# Patient Record
Sex: Male | Born: 1985 | Race: Black or African American | Hispanic: No | Marital: Single | Smoking: Current some day smoker
Health system: Southern US, Community
[De-identification: ages and names within clinical notes are randomized; demographics above are authoritative.]

## PROBLEM LIST (undated history)

## (undated) ENCOUNTER — Emergency Department (HOSPITAL_COMMUNITY): Payer: Self-pay

## (undated) DIAGNOSIS — F319 Bipolar disorder, unspecified: Secondary | ICD-10-CM

## (undated) DIAGNOSIS — F29 Unspecified psychosis not due to a substance or known physiological condition: Secondary | ICD-10-CM

## (undated) DIAGNOSIS — F209 Schizophrenia, unspecified: Secondary | ICD-10-CM

---

## 2006-10-02 DIAGNOSIS — F319 Bipolar disorder, unspecified: Secondary | ICD-10-CM

## 2006-10-02 HISTORY — DX: Bipolar disorder, unspecified: F31.9

## 2008-07-05 ENCOUNTER — Emergency Department (HOSPITAL_COMMUNITY): Admission: EM | Admit: 2008-07-05 | Discharge: 2008-07-06 | Payer: Self-pay | Admitting: Emergency Medicine

## 2008-07-23 ENCOUNTER — Ambulatory Visit: Payer: Self-pay | Admitting: Psychiatry

## 2008-07-23 ENCOUNTER — Inpatient Hospital Stay (HOSPITAL_COMMUNITY): Admission: AD | Admit: 2008-07-23 | Discharge: 2008-07-30 | Payer: Self-pay | Admitting: Psychiatry

## 2008-08-02 ENCOUNTER — Emergency Department (HOSPITAL_COMMUNITY): Admission: EM | Admit: 2008-08-02 | Discharge: 2008-08-02 | Payer: Self-pay | Admitting: Emergency Medicine

## 2009-06-12 ENCOUNTER — Emergency Department (HOSPITAL_COMMUNITY): Admission: EM | Admit: 2009-06-12 | Discharge: 2009-06-12 | Payer: Self-pay | Admitting: Emergency Medicine

## 2011-01-06 LAB — COMPREHENSIVE METABOLIC PANEL
ALT: 21 U/L (ref 0–53)
Albumin: 5.1 g/dL (ref 3.5–5.2)
Alkaline Phosphatase: 93 U/L (ref 39–117)
Calcium: 10 mg/dL (ref 8.4–10.5)
Glucose, Bld: 117 mg/dL — ABNORMAL HIGH (ref 70–99)
Potassium: 3.8 mEq/L (ref 3.5–5.1)
Sodium: 138 mEq/L (ref 135–145)
Total Protein: 8.3 g/dL (ref 6.0–8.3)

## 2011-01-06 LAB — CBC
MCHC: 34.1 g/dL (ref 30.0–36.0)
Platelets: 215 10*3/uL (ref 150–400)
RDW: 12.8 % (ref 11.5–15.5)

## 2011-01-06 LAB — DIFFERENTIAL
Basophils Relative: 0 % (ref 0–1)
Eosinophils Absolute: 0 10*3/uL (ref 0.0–0.7)
Lymphs Abs: 0.7 10*3/uL (ref 0.7–4.0)
Monocytes Absolute: 0.6 10*3/uL (ref 0.1–1.0)
Monocytes Relative: 5 % (ref 3–12)
Neutro Abs: 10.9 10*3/uL — ABNORMAL HIGH (ref 1.7–7.7)
Neutrophils Relative %: 89 % — ABNORMAL HIGH (ref 43–77)

## 2011-01-06 LAB — POCT CARDIAC MARKERS
CKMB, poc: 6.9 ng/mL (ref 1.0–8.0)
Myoglobin, poc: 498 ng/mL (ref 12–200)
Myoglobin, poc: >500 ng/mL (ref 12–200)
Troponin i, poc: <0.05 ng/mL (ref 0.00–0.09)

## 2011-01-06 LAB — POCT I-STAT, CHEM 8
BUN: 27 mg/dL — ABNORMAL HIGH (ref 6–23)
Calcium, Ion: 1.15 mmol/L (ref 1.12–1.32)
Creatinine, Ser: 1.4 mg/dL (ref 0.4–1.5)
Glucose, Bld: 112 mg/dL — ABNORMAL HIGH (ref 70–99)
TCO2: 24 mmol/L (ref 0–100)

## 2011-01-06 LAB — URINE MICROSCOPIC-ADD ON

## 2011-01-06 LAB — URINALYSIS, ROUTINE W REFLEX MICROSCOPIC
Glucose, UA: NEGATIVE mg/dL
Hgb urine dipstick: NEGATIVE
Specific Gravity, Urine: 1.035 — ABNORMAL HIGH (ref 1.005–1.030)

## 2011-01-06 LAB — ETHANOL: Alcohol, Ethyl (B): <5 mg/dL (ref 0–10)

## 2011-01-06 LAB — RAPID URINE DRUG SCREEN, HOSP PERFORMED: Barbiturates: NOT DETECTED

## 2011-02-14 NOTE — Discharge Summary (Signed)
NAME:  Isaac Cabrera, Isaac Cabrera NO.:  [id number]   MEDICAL RECORD NO.:  [id number]          PATIENT TYPE:  IPS   LOCATION:  0501                          FACILITY:  [facility]   PHYSICIAN:  Anselm Jungling, MD  DATE OF BIRTH:  [birth year]   DATE OF ADMISSION:  07/23/2008  DATE OF DISCHARGE:  07/30/2008                               DISCHARGE SUMMARY   IDENTIFYING DATA/REASON FOR ADMISSION:  The patient is a 25 year old  single African American male who was admitted for treatment of symptoms  of psychosis, in a context of substance abuse.  Please refer to the  admission note for further details pertaining to the symptoms,  circumstances and history that led to his hospitalization.  He was given  an initial Axis I diagnosis of mood disorder NOS, substance abuse NOS,  and psychosis NOS.   MEDICAL AND LABORATORY:  The patient was in good health without any  active or chronic medical problems.  He was medically and physically  assessed by the psychiatric nurse practitioner.  There were no  significant medical issues.   HOSPITAL COURSE:  The patient was admitted to the adult inpatient  psychiatric service.  He presented as a well-nourished, normally-  developed male who was alert, fully oriented, and pleasant and open in  the initial interview.  His thoughts and speech appeared normally  organized.  He did not appear to be depressed.  He gave [place] permission to  contact his parents.  He denied any subjective complaints whatsoever.   In contacting his parents, and obtaining further background information  on this young man, we learned that he had gone through significant  personality change over the previous year, having become progressively  more irresponsible, irritable, and involved in substance abuse,  apparently primarily marijuana.  He had become belligerent and sarcastic  with his family.  They also described psychotic behaviors, such as  putting gasoline on plants in the yard,  and other bizarre behaviors.  With this information, it appeared the patient was most likely  exhibiting signs and symptoms of an insidious onset of a schizophrenic  or schizoaffective disorder.  As such, he was treated with Risperdal,  and Depakote.  It turns out that the patient had been treated with these  medications previously, when similar symptoms had emerged in the past.   For the most part, he remained pleasant and cooperative throughout his  stay, active, talkative, and highly sociable.  He was generally pleasant  and in a good mood.  He did not show much insight at any point into the  nature of the problems that brought him, but at the very least was  giving lip service to the notion that he had a problem with drug abuse  and needed to remain abstinent.   His parents clarified for [place] while he was year that they would not take  him back into their home.  As such, the patient worked closely with case  manager towards finding of residential situation for himself.  Ultimately, the plan was for the patient to go live with his pastor.  Follow-up was to be arranged as below with the [facility].   AFTERCARE:  The patient was to be seen at the [facility] for an  appointment with their psychiatrist on August 06, 2008.   DISCHARGE MEDICATIONS:  1. Risperdal 3 mg nightly.  2. Depakote 1000 mg nightly.   DISCHARGE DIAGNOSES:  AXIS I:  1. Schizoaffective disorder not otherwise specified.  2. Polysubstance abuse, early remission.  AXIS II:  Deferred.  AXIS III:  No acute or chronic illnesses.  AXIS IV:  Stressors severe.  AXIS V: Global assessment of functioning on discharge 55.      Anselm Jungling, MD  Electronically Signed     SPB/MEDQ  D:  07/31/2008  T:  07/31/2008  Job:  [id number]

## 2011-02-14 NOTE — H&P (Signed)
NAME:  Isaac Cabrera, Isaac Cabrera NO.:  [id number]   MEDICAL RECORD NO.:  [id number]          PATIENT TYPE:  IPS   LOCATION:  0402                          FACILITY:  [facility]   PHYSICIAN:  Anselm Jungling, MD  DATE OF BIRTH:  19-Mar-1986   DATE OF ADMISSION:  07/23/2008  DATE OF DISCHARGE:                       PSYCHIATRIC ADMISSION ASSESSMENT   IDENTIFYING INFORMATION:  This is a 25 year old African American male  who is single.  This is an involuntary admission.   HISTORY OF THE PRESENT ILLNESS:  This is the first [facility] admission for  this young man after his parents petitioned on him on or about July 19, 2008.  He was taken to [facility] and was there for several  days awaiting transfer to [facility], however, no bed was  available so he was referred to our service.  He is petitioned for  behaviors including threatening his parents who called police to the  home.  [facility] has reported that he has a history of  schizoaffective disorder and has been noncompliant with Risperdal in the  past.  His parents have complained that he is also hearing voices which  was validated by [facility].  Today he is a fully alert male in  full contact with reality with normal speech denying any suicidal or  homicidal thoughts.  He admits that he had an argument with his father.  Drinking and using drugs is unclear.  His urine drug screen done in the  emergency room on July 05, 2008, was positive for  tetrahydrocannabinoids and his alcohol level was less than 5.  He denies  any hallucinations.  Denies any dangerous thoughts today.   PAST PSYCHIATRIC HISTORY:  First [facility] admission.  He reports he has not  been taking any medications.  He has a history of 1 prior admission at  [facility] in January of this year and [facility] had reported that he was previously noncompliant with Risperdal,  Invega, Depakote, and Cogentin.   SOCIAL  HISTORY:  A single African American male living at home with his  parents.  Unemployed.  No children.   FAMILY HISTORY:  Not available.   MEDICAL HISTORY:  No regular primary care.   MEDICAL PROBLEMS:  None.   MEDICATIONS:  None.   DRUG ALLERGIES:  None.   PHYSICAL EXAM:  Well-nourished, well-developed [place] American male.  He is in no distress, pleasant, cooperative, 6 feet 2 inches tall, 156  pounds.  Temp 97.1.  Pulse 68.  Respirations 16.  Blood pressure 135/85.  Well nourished and well developed, in no acute distress.  HEAD:  Normocephalic and atraumatic.  EENT:  PERRL.  Sclerae nonicteric.  Extraocular movements are normal.  NECK:  Supple.  No thyromegaly.  No lymphadenopathy.  CHEST:  Clear to auscultation.  CARDIOVASCULAR:  S1 and S2 are heard.  No clicks, murmurs, gallops, or  extra sounds.  ABDOMEN:  Soft, nontender, nondistended.  EXTREMITIES:  Have 2+ pulses, pink and warm, good circulation.  GENITALIA:  Deferred.  NEURO:  Cranial nerves II-XII are intact.  Motor  is intact.  Sensory is  intact.  Gait is normal.  Normal arm swing.  Romberg without findings  and nonfocal.   BASIC LABS:  Were done on July 05, 2008, in the emergency room.  Chemistry, sodium 139, potassium 3.7, chloride 106, carbon dioxide 23,  BUN 17, creatinine 1.27, random glucose 74.  Liver enzymes, SGOT 41,  SGPT 20, alkaline phosphatase is 73, and total bilirubin 3.1.  Routine  urinalysis was remarkable for mildly elevated, specific gravity at  1.041, ketones greater than 80, protein 100 mg/dL.  CBC, WBC 8.8,  hemoglobin 15.3, hematocrit 45.1, platelets 234,000, and MCV 89.8.   MENTAL STATUS EXAM:  Fully alert male, pleasant, cooperative, with  superficial insight.  Speech is normal and nonpressured.  Mood is  neutral.  Thought process logical and coherent.  He is oriented x4.  Eye  contact is adequate.  Speech is normally organized.  He does not appear  to be depressed.  No delusional  statements made.  Does not appear to be  internally distracted.  He gave [place] an okay to contact his parents.  Denies any subjective complaints whatsoever.  Admits that he had an  argument with his father.   AXIS I:  1. Rule out mood disorder, NOS.  2. Rule out psychosis.  AXIS II:  No diagnosis.  AXIS III:  No diagnosis.  AXIS IV:  Rule out conflict with parents.  AXIS V:  Current 64, past year not known.   PLAN:  Involuntarily admit him.  We are going to contact his parents,  try to get some additional history, determine what the issues are and  see what their concerns are.      Margaret A. Lorin Picket, N.P.      Anselm Jungling, MD  Electronically Signed    MAS/MEDQ  D:  07/24/2008  T:  07/24/2008  Job:  [id number]

## 2011-07-04 LAB — COMPREHENSIVE METABOLIC PANEL
Albumin: 4.3
BUN: 18
CO2: 23
Calcium: 9.6
Chloride: 104
Creatinine, Ser: 1.27
Creatinine, Ser: 1.07
GFR calc Af Amer: >60
GFR calc non Af Amer: >60
GFR calc non Af Amer: >60
Glucose, Bld: 74
Total Bilirubin: 1.7 — ABNORMAL HIGH

## 2011-07-04 LAB — COMPREHENSIVE METABOLIC PANEL WITH GFR
ALT: 20
AST: 41 — ABNORMAL HIGH
Albumin: 4.5
Alkaline Phosphatase: 73
BUN: 17
Chloride: 106
Potassium: 3.7
Sodium: 139
Total Bilirubin: 3.1 — ABNORMAL HIGH
Total Protein: 7

## 2011-07-04 LAB — URINALYSIS, ROUTINE W REFLEX MICROSCOPIC
Bilirubin Urine: NEGATIVE
Glucose, UA: NEGATIVE
Glucose, UA: NEGATIVE
Hgb urine dipstick: NEGATIVE
Ketones, ur: >80 — AB
Leukocytes, UA: NEGATIVE
Nitrite: NEGATIVE
Protein, ur: 100 — AB
Specific Gravity, Urine: 1.041 — ABNORMAL HIGH
Urobilinogen, UA: 0.2
pH: 6
pH: 6

## 2011-07-04 LAB — DIFFERENTIAL
Basophils Absolute: 0
Basophils Absolute: 0
Basophils Relative: 1
Eosinophils Absolute: 1 — ABNORMAL HIGH
Eosinophils Relative: 11 — ABNORMAL HIGH
Lymphocytes Relative: 15
Lymphocytes Relative: 16
Lymphs Abs: 1.3
Monocytes Absolute: 0.6
Monocytes Absolute: 0.4
Monocytes Relative: 7
Neutro Abs: 5.8
Neutro Abs: 3.9
Neutrophils Relative %: 66
Neutrophils Relative %: 71

## 2011-07-04 LAB — RAPID URINE DRUG SCREEN, HOSP PERFORMED
Amphetamines: NOT DETECTED
Barbiturates: NOT DETECTED
Barbiturates: NOT DETECTED
Benzodiazepines: NOT DETECTED
Benzodiazepines: POSITIVE — AB
Cocaine: NOT DETECTED
Cocaine: NOT DETECTED
Opiates: NOT DETECTED
Tetrahydrocannabinol: POSITIVE — AB

## 2011-07-04 LAB — CBC
HCT: 45.1
HCT: 46.4
Hemoglobin: 15.3
MCHC: 33.9
MCV: 89.8
MCV: 90.9
Platelets: 234
Platelets: 238
RBC: 5.02
RDW: 13.5
WBC: 8.8
WBC: 5.5

## 2011-07-04 LAB — URINE MICROSCOPIC-ADD ON

## 2011-07-04 LAB — ETHANOL: Alcohol, Ethyl (B): <5

## 2012-07-01 ENCOUNTER — Encounter (HOSPITAL_COMMUNITY): Payer: Self-pay | Admitting: *Deleted

## 2012-07-01 ENCOUNTER — Emergency Department (HOSPITAL_COMMUNITY)
Admission: EM | Admit: 2012-07-01 | Discharge: 2012-07-01 | Discharge status: Against Medical Advice | Payer: Self-pay | Attending: Emergency Medicine | Admitting: Emergency Medicine

## 2012-07-01 DIAGNOSIS — Z5900 Homelessness unspecified: Secondary | ICD-10-CM | POA: Insufficient documentation

## 2012-07-01 LAB — RAPID URINE DRUG SCREEN, HOSP PERFORMED
Amphetamines: NOT DETECTED
Benzodiazepines: NOT DETECTED

## 2012-07-01 LAB — CBC
Platelets: 257 10*3/uL (ref 150–400)
RDW: 12.3 % (ref 11.5–15.5)
WBC: 6.4 10*3/uL (ref 4.0–10.5)

## 2012-07-01 LAB — COMPREHENSIVE METABOLIC PANEL
AST: 27 U/L (ref 0–37)
Albumin: 4.4 g/dL (ref 3.5–5.2)
Alkaline Phosphatase: 65 U/L (ref 39–117)
Chloride: 98 mEq/L (ref 96–112)
Creatinine, Ser: 1.2 mg/dL (ref 0.50–1.35)
Potassium: 3.7 mEq/L (ref 3.5–5.1)
Sodium: 135 mEq/L (ref 135–145)
Total Bilirubin: 1.1 mg/dL (ref 0.3–1.2)

## 2012-07-01 LAB — ACETAMINOPHEN LEVEL: Acetaminophen (Tylenol), Serum: <15 ug/mL (ref 10–30)

## 2012-07-01 LAB — SALICYLATE LEVEL: Salicylate Lvl: <2 mg/dL — ABNORMAL LOW (ref 2.8–20.0)

## 2012-07-01 NOTE — ED Notes (Signed)
Pt denies SI, then states he is "slightly suicidal". When asked if he made a plan, states "I didn't think I'd live this long" Pt states "I'm out of my luck." Pt states "My parents kicked me out. My boss kicked me out. I don't have anywhere to go." Pt unable to state if he has a mental illness but stated he needs "lithium or something" Pt states he has taken medications before but unable to remember what meds. Pt is belligerent and uncooperative. Pt asking [employer] if he can go to [employer].

## 2012-07-01 NOTE — ED Notes (Signed)
Patient is alert and oriented x3.  He left AMA.  Patient gave verbal understanding of his need to stay.   He was not showing any signs of distress on DC

## 2012-08-07 ENCOUNTER — Encounter (HOSPITAL_COMMUNITY): Payer: Self-pay | Admitting: Emergency Medicine

## 2012-08-07 ENCOUNTER — Emergency Department (HOSPITAL_COMMUNITY)
Admission: EM | Admit: 2012-08-07 | Discharge: 2012-08-11 | Disposition: A | Discharge status: Other Acute Inpatient Hospital | Payer: Self-pay | Attending: Emergency Medicine | Admitting: Emergency Medicine

## 2012-08-07 DIAGNOSIS — F17200 Nicotine dependence, unspecified, uncomplicated: Secondary | ICD-10-CM | POA: Insufficient documentation

## 2012-08-07 DIAGNOSIS — F209 Schizophrenia, unspecified: Secondary | ICD-10-CM | POA: Insufficient documentation

## 2012-08-07 DIAGNOSIS — F29 Unspecified psychosis not due to a substance or known physiological condition: Secondary | ICD-10-CM

## 2012-08-07 HISTORY — DX: Schizophrenia, unspecified: F20.9

## 2012-08-07 HISTORY — DX: Unspecified psychosis not due to a substance or known physiological condition: F29

## 2012-08-07 LAB — CBC WITH DIFFERENTIAL/PLATELET
Basophils Absolute: 0 10*3/uL (ref 0.0–0.1)
Basophils Relative: 1 % (ref 0–1)
Eosinophils Absolute: 0.2 10*3/uL (ref 0.0–0.7)
Lymphs Abs: 2.3 10*3/uL (ref 0.7–4.0)
MCH: 31.1 pg (ref 26.0–34.0)
MCHC: 35.6 g/dL (ref 30.0–36.0)
Neutrophils Relative %: 58 % (ref 43–77)
Platelets: 246 10*3/uL (ref 150–400)
RBC: 4.53 MIL/uL (ref 4.22–5.81)
RDW: 12.2 % (ref 11.5–15.5)

## 2012-08-07 LAB — BASIC METABOLIC PANEL
Calcium: 9.5 mg/dL (ref 8.4–10.5)
GFR calc non Af Amer: >90 mL/min (ref >90)
Glucose, Bld: 92 mg/dL (ref 70–99)
Sodium: 141 mEq/L (ref 135–145)

## 2012-08-07 LAB — RAPID URINE DRUG SCREEN, HOSP PERFORMED
Cocaine: NOT DETECTED
Opiates: NOT DETECTED

## 2012-08-07 MED ORDER — ZOLPIDEM TARTRATE 5 MG PO TABS: 5.0000 mg | ORAL_TABLET | Freq: Every evening | ORAL | Status: DC | PRN

## 2012-08-07 MED ORDER — ACETAMINOPHEN 325 MG PO TABS: 650.0000 mg | ORAL_TABLET | ORAL | Status: DC | PRN

## 2012-08-07 MED ORDER — LORAZEPAM 1 MG PO TABS
1.0000 mg | ORAL_TABLET | Freq: Three times a day (TID) | ORAL | Status: DC | PRN
  Administered 2012-08-11: 1 mg via ORAL
  Filled 2012-08-07: qty 1

## 2012-08-07 MED ORDER — NICOTINE 21 MG/24HR TD PT24: 21.0000 mg | MEDICATED_PATCH | Freq: Every day | TRANSDERMAL | Status: DC | PRN

## 2012-08-07 MED ORDER — IBUPROFEN 200 MG PO TABS: 400.0000 mg | ORAL_TABLET | Freq: Three times a day (TID) | ORAL | Status: DC | PRN

## 2012-08-07 MED ORDER — ALUM & MAG HYDROXIDE-SIMETH 200-200-20 MG/5ML PO SUSP: 30.0000 mL | ORAL | Status: DC | PRN

## 2012-08-07 MED ORDER — ONDANSETRON HCL 4 MG PO TABS: 4.0000 mg | ORAL_TABLET | Freq: Three times a day (TID) | ORAL | Status: DC | PRN

## 2012-08-07 NOTE — ED Notes (Signed)
Bed:WA25<BR> Expected date:<BR> Expected time:<BR> Means of arrival:Ambulance<BR> Comments:<BR> fall

## 2012-08-07 NOTE — ED Notes (Signed)
Patient has two personal belonging bags, security put bags in locker 34

## 2012-08-07 NOTE — ED Provider Notes (Signed)
History     CSN: [id number]  Arrival date & time 08/07/12  1118   First MD Initiated Contact with Patient 08/07/12 1126      Chief Complaint  Patient presents with  . Medical Clearance     The history is provided by the patient, the EMS personnel and the police. History Limited By: Hx psychosis.   Pt was seen at 1140.  Per EMS and Police, pt found sleeping on his father's yard, threatening to burn the house down.  Brought in under IVC for "not taking care of himself" (ie: hygiene).  Pt states he "hasn't taken my meds" in 3 years for unk mental illness.  Pt states he stopped taking his meds because "they were poisoning me" and he "doesn't need them" because Jesus has helped him "because the bible floats."     Past Medical History  Diagnosis Date  . Schizophrenia   . Psychosis     History reviewed. No pertinent past surgical history.   History  Substance Use Topics  . Smoking status: Current Some Day Smoker    Types: Cigarettes  . Smokeless tobacco: Not on file  . Alcohol Use: No      Review of Systems  Unable to perform ROS: Psychiatric disorder    Allergies  Review of patient's allergies indicates no known allergies.  Home Medications  No current outpatient prescriptions on file.  BP 130/86  Pulse 82  Temp 98.1 F (36.7 C) (Oral)  Resp 18  SpO2 97%  Physical Exam 1145: Physical examination:  Nursing notes reviewed; Vital signs and O2 SAT reviewed;  Constitutional: Well developed, Well nourished, Well hydrated, In no acute distress; Head:  Normocephalic, atraumatic; Eyes: EOMI, PERRL, No scleral icterus; ENMT: Mouth and pharynx normal, Mucous membranes moist; Neck: Supple, Full range of motion; Cardiovascular: Regular rate and rhythm.; Respiratory: Speaking full sentences with ease, Normal respiratory effort/excursion; Chest: Nontender, Movement normal;; Extremities: Pulses normal, No tenderness, No edema, No calf edema or asymmetry.; Neuro: AA&Ox3, Major CN  grossly intact.  Speech clear. Gait steady. No gross focal motor or sensory deficits in extremities.; Skin: Color normal, Warm, Dry.; Psych:  Affect flat, poor eye contact, +random, rapid, tangential speech, +appears responding to internal stimuli.    ED Course  Procedures     MDM  MDM Reviewed: previous chart, nursing note and vitals Interpretation: labs     Results for orders placed during the hospital encounter of 08/07/12  BASIC METABOLIC PANEL      Component Value Range   Sodium 141  135 - 145 mEq/L   Potassium 3.8  3.5 - 5.1 mEq/L   Chloride 105  96 - 112 mEq/L   CO2 24  19 - 32 mEq/L   Glucose, Bld 92  70 - 99 mg/dL   BUN 21  6 - 23 mg/dL   Creatinine, Ser 4.09  0.50 - 1.35 mg/dL   Calcium 9.5  8.4 - 81.1 mg/dL   GFR calc non Af Amer >90  >90 mL/min   GFR calc Af Amer >90  >90 mL/min  CBC WITH DIFFERENTIAL      Component Value Range   WBC 7.3  4.0 - 10.5 K/uL   RBC 4.53  4.22 - 5.81 MIL/uL   Hemoglobin 14.1  13.0 - 17.0 g/dL   HCT 91.4  78.2 - 95.6 %   MCV 87.4  78.0 - 100.0 fL   MCH 31.1  26.0 - 34.0 pg   MCHC 35.6  30.0 -  36.0 g/dL   RDW 16.1  09.6 - 04.5 %   Platelets 246  150 - 400 K/uL   Neutrophils Relative 58  43 - 77 %   Neutro Abs 4.2  1.7 - 7.7 K/uL   Lymphocytes Relative 32  12 - 46 %   Lymphs Abs 2.3  0.7 - 4.0 K/uL   Monocytes Relative 7  3 - 12 %   Monocytes Absolute 0.5  0.1 - 1.0 K/uL   Eosinophils Relative 3  0 - 5 %   Eosinophils Absolute 0.2  0.0 - 0.7 K/uL   Basophils Relative 1  0 - 1 %   Basophils Absolute 0.0  0.0 - 0.1 K/uL  URINE RAPID DRUG SCREEN (HOSP PERFORMED)      Component Value Range   Opiates NONE DETECTED  NONE DETECTED   Cocaine NONE DETECTED  NONE DETECTED   Benzodiazepines NONE DETECTED  NONE DETECTED   Amphetamines NONE DETECTED  NONE DETECTED   Tetrahydrocannabinol NONE DETECTED  NONE DETECTED   Barbiturates NONE DETECTED  NONE DETECTED  ETHANOL      Component Value Range   Alcohol, Ethyl (B) <11  0 - 11 mg/dL       4098:  Pt calm /cooperative. ACT aware of need for pt eval for admit.          Laray Anger, DO 08/08/12 1531

## 2012-08-07 NOTE — ED Notes (Signed)
Per patient, states he was sleeping in dads yard and the police brought him here-per police, was making threats to burn house down

## 2012-08-07 NOTE — BH Assessment (Signed)
Assessment Note   Isaac Cabrera is a 26 y.o. male who presents to [facility] under IV petition. Petition was taken out by pt's father Dozier Berkovich [phone]). Per petition, pt has been behaving irrationally, not tending to hygine, not eating or sleeping properly, and stated that he wanted to be put to sleep like a dog. Petition also states that pt threatened to burn his parents house down.  This Clinical research associate spoke with pt's father via phone. Per father, pt has a long history of mental health concerns and has received inpatient treatment numerous times in several different states between 2007 and currently. He reports pt recently lost his job and has been homeless for the past few weeks. father states family has a restraining order against pt and that pt has been attempting to throw bricks through their windows and has been sleeping on their porch. He also states that pt threatened to burn the families house down.   Pt denies SI, HI, AHVH, and SA. He states he is here because he is homeless and the police brought him the hospital. He reports he has not been eating or sleeping because he has no where to sleep and no way to get food. He reports a history of mental health concerns, stating he has received inpatient treatment several times. He endorses current depressive symptoms and anxiety. He denies being on any medications. Pt is calm, cooperative, and oriented times 4.       Axis I: Schizoaffective Disorder Axis II: Deferred Axis III:  Past Medical History  Diagnosis Date  . Schizophrenia   . Psychosis    Axis IV: other psychosocial or environmental problems Axis V: 31-40 impairment in reality testing  Past Medical History:  Past Medical History  Diagnosis Date  . Schizophrenia   . Psychosis     History reviewed. No pertinent past surgical history.  Family History: No family history on file.  Social History:  reports that he has been smoking Cigarettes.  He does not have any smokeless tobacco  history on file. He reports that he does not drink alcohol or use illicit drugs.  Additional Social History:  Alcohol / Drug Use History of alcohol / drug use?: Yes Substance #1 Name of Substance 1: THC 1 - Age of First Use: teens 1 - Amount (size/oz): varries 1 - Frequency: varries  1 - Duration: on and off since teens 1 - Last Use / Amount: states he has not used for several months  CIWA: CIWA-Ar BP: 108/69 mmHg Pulse Rate: 55  COWS:    Allergies: No Known Allergies  Home Medications:  (Not in a hospital admission)  OB/GYN Status:  No LMP for male patient.  General Assessment Data Location of Assessment: [facility] Living Arrangements: Other (Comment) (homeless) Can pt return to current living arrangement?: Yes Admission Status: Voluntary Is patient capable of signing voluntary admission?: Yes Transfer from: Acute Hospital Referral Source: MD  Education Status Is patient currently in school?: No Highest grade of school patient has completed: highschool  Risk to self Suicidal Ideation: No-Not Currently/Within Last 6 Months Suicidal Intent: No Is patient at risk for suicide?: No Suicidal Plan?: No Access to Means: No What has been your use of drugs/alcohol within the last 12 months?: THC Previous Attempts/Gestures: No How many times?: 0  Other Self Harm Risks: none Triggers for Past Attempts: None known Intentional Self Injurious Behavior: None Family Suicide History: No Recent stressful life event(s): Other (Comment) (recent homelessness) Persecutory voices/beliefs?: No (pt denies) Depression:  Yes Depression Symptoms: Despondent;Feeling angry/irritable Substance abuse history and/or treatment for substance abuse?: Yes Suicide prevention information given to non-admitted patients: Not applicable  Risk to Others Current Homicidal Intent: No Current Homicidal Plan: No Access to Homicidal Means: No Identified Victim: none History of harm to others?:  Yes Assessment of Violence: None Noted Violent Behavior Description: cooperative Does patient have access to weapons?: No Criminal Charges Pending?: Yes Describe Pending Criminal Charges: resiting aresset  (assault on goverment offical, intixicated and disruptive) Does patient have a court date:  (unknown, pt denies)  Psychosis Hallucinations: None noted (pt has a history of auditory hallucinations ) Delusions: None noted  Mental Status Report Appear/Hygiene: Disheveled Eye Contact: Poor Motor Activity: Unremarkable Speech: Logical/coherent Level of Consciousness: Quiet/awake Mood: Ambivalent Affect: Apathetic Anxiety Level: None Thought Processes: Coherent;Relevant Judgement: Unimpaired Orientation: Person;Place;Time;Situation Obsessive Compulsive Thoughts/Behaviors: None  Cognitive Functioning Concentration: Normal Memory: Recent Intact;Remote Intact IQ: Average Insight: Poor Impulse Control: Poor Appetite: Fair Weight Loss: 0  Weight Gain: 0  Sleep: Decreased Vegetative Symptoms: None  ADLScreening [facility] Assessment Services) Patient's cognitive ability adequate to safely complete daily activities?: Yes Patient able to express need for assistance with ADLs?: Yes Independently performs ADLs?: Yes (appropriate for developmental age)  Abuse/Neglect [facility]) Physical Abuse: Denies Verbal Abuse: Denies Sexual Abuse: Denies  Prior Inpatient Therapy Prior Inpatient Therapy: Yes Prior Therapy Dates: 2007-2013 Prior Therapy Facilty/Provider(s): [facility], [facility], [facility], [facility], and out of state facilites Reason for Treatment: HI, [facility], and SA  Prior Outpatient Therapy Prior Outpatient Therapy: No Prior Therapy Dates: na Prior Therapy Facilty/Provider(s): na Reason for Treatment: na  ADL Screening (condition at time of admission) Patient's cognitive ability adequate to safely complete daily activities?: Yes Patient able to express need for assistance with ADLs?:  Yes Independently performs ADLs?: Yes (appropriate for developmental age) Weakness of Legs: None Weakness of Arms/Hands: None  Home Assistive Devices/Equipment Home Assistive Devices/Equipment: None    Abuse/Neglect Assessment (Assessment to be complete while patient is alone) Physical Abuse: Denies Verbal Abuse: Denies Sexual Abuse: Denies Exploitation of patient/patient's resources: Denies Self-Neglect: Denies Values / Beliefs Cultural Requests During Hospitalization: None Spiritual Requests During Hospitalization: None   Advance Directives (For Healthcare) Advance Directive: Patient does not have advance directive;Patient would not like information Pre-existing out of facility DNR order (yellow form or pink MOST form): No Nutrition Screen- [facility] Patient's home diet: Regular Have you recently lost weight without trying?: No Have you been eating poorly because of a decreased appetite?: No Malnutrition Screening Tool Score: 0   Additional Information 1:1 In Past 12 Months?: No CIRT Risk: No Elopement Risk: No Does patient have medical clearance?: Yes     Disposition:  Disposition Disposition of Patient: Other dispositions (pending telepsych)  On Site Evaluation by:   Reviewed with Physician:     Georgina Quint A 08/07/2012 10:57 PM

## 2012-08-08 MED ORDER — LORAZEPAM 2 MG/ML IJ SOLN: 2.0000 mg | Freq: Four times a day (QID) | INTRAMUSCULAR | Status: DC | PRN

## 2012-08-08 MED ORDER — LORAZEPAM 1 MG PO TABS: 2.0000 mg | ORAL_TABLET | Freq: Four times a day (QID) | ORAL | Status: DC | PRN

## 2012-08-08 MED ORDER — ZIPRASIDONE MESYLATE 20 MG IM SOLR
20.0000 mg | Freq: Once | INTRAMUSCULAR | Status: AC
  Administered 2012-08-08: 20 mg via INTRAMUSCULAR

## 2012-08-08 MED ORDER — ZIPRASIDONE MESYLATE 20 MG IM SOLR
20.0000 mg | Freq: Two times a day (BID) | INTRAMUSCULAR | Status: DC | PRN
  Administered 2012-08-10: 20 mg via INTRAMUSCULAR
  Filled 2012-08-08 (×2): qty 20

## 2012-08-08 NOTE — ED Provider Notes (Signed)
Pt sleeping comfortably this morning. He has hx of schizophrenia, has been accepted to [facility], awaiting availability of bed.    Ethelda Chick, MD 08/08/12 [phone]

## 2012-08-08 NOTE — ED Provider Notes (Signed)
  Physical Exam  BP 108/69  Pulse 55  Temp 97.9 F (36.6 C) (Oral)  Resp 18  SpO2 99%  Physical Exam  ED Course  Procedures  MDM Telemetry psychiatry evaluation and agrees with inpatient stay, recommends Geodon now, Geodon every 12 hours and Ativan every 6 hours both when necessary. I have written these orders     Vida Roller, MD 08/08/12 [phone]

## 2012-08-09 NOTE — ED Notes (Signed)
Up to the bathroom 

## 2012-08-09 NOTE — ED Notes (Signed)
Up in room, watching tv, eating snack.

## 2012-08-09 NOTE — ED Provider Notes (Signed)
Isaac Cabrera is a 26 y.o. male waiting [placement at the [facility]. He has been accepted. He has no c/o  [name], MD 08/09/12 [phone]

## 2012-08-09 NOTE — ED Notes (Signed)
Dr wentz into see 

## 2012-08-09 NOTE — ED Notes (Signed)
Up to the bathroom to shower and change scubs 

## 2012-08-09 NOTE — [facility] Counselor (Signed)
Pt has been accepted to [facility] by Donell Sievert to the care of Dr. Jannifer Franklin room 403-1. EDP notified and agrees with plan. Support paperwork has been faxed to [facility]

## 2012-08-09 NOTE — ED Notes (Signed)
Pt unable to go to [facility] at this time.  Room became unavailable.

## 2012-08-10 NOTE — ED Notes (Signed)
Up to the bathroom 

## 2012-08-10 NOTE — ED Provider Notes (Signed)
8:04 AM Awaiting placement. Was accepted to [facility], now, no bed available. No complaints today  Filed Vitals:   08/10/12 0411  BP: 118/68  Pulse: 54  Temp: 97.2 F (36.2 C)  Resp: 18     Lyanne Co, MD 08/10/12 [phone]

## 2012-08-10 NOTE — ED Notes (Signed)
patient went into shower.

## 2012-08-10 NOTE — ED Notes (Signed)
Double portions TORB dr campos 

## 2012-08-11 ENCOUNTER — Encounter (HOSPITAL_COMMUNITY): Payer: Self-pay | Admitting: *Deleted

## 2012-08-11 ENCOUNTER — Inpatient Hospital Stay (HOSPITAL_COMMUNITY)
Admission: AD | Admit: 2012-08-11 | Discharge: 2012-08-19 | DRG: 885 | Disposition: A | Discharge status: Home / Self Care | Payer: Federal, State, Local not specified - Other | Source: Ambulatory Visit | Attending: Psychiatry | Admitting: Psychiatry

## 2012-08-11 DIAGNOSIS — F602 Antisocial personality disorder: Secondary | ICD-10-CM | POA: Diagnosis present

## 2012-08-11 DIAGNOSIS — F39 Unspecified mood [affective] disorder: Secondary | ICD-10-CM | POA: Diagnosis present

## 2012-08-11 DIAGNOSIS — M25529 Pain in unspecified elbow: Secondary | ICD-10-CM | POA: Diagnosis present

## 2012-08-11 DIAGNOSIS — F22 Delusional disorders: Secondary | ICD-10-CM | POA: Diagnosis present

## 2012-08-11 DIAGNOSIS — F259 Schizoaffective disorder, unspecified: Principal | ICD-10-CM | POA: Diagnosis present

## 2012-08-11 HISTORY — DX: Bipolar disorder, unspecified: F31.9

## 2012-08-11 MED ORDER — TRAZODONE HCL 50 MG PO TABS
50.0000 mg | ORAL_TABLET | Freq: Every evening | ORAL | Status: DC | PRN
  Administered 2012-08-11 – 2012-08-13 (×3): 50 mg via ORAL
  Filled 2012-08-11 (×3): qty 1

## 2012-08-11 MED ORDER — MAGNESIUM HYDROXIDE 400 MG/5ML PO SUSP: 30.0000 mL | Freq: Every day | ORAL | Status: DC | PRN

## 2012-08-11 MED ORDER — NICOTINE POLACRILEX 2 MG MT GUM
CHEWING_GUM | OROMUCOSAL | Status: AC
  Administered 2012-08-11: 22:00:00
  Filled 2012-08-11: qty 1

## 2012-08-11 MED ORDER — NICOTINE POLACRILEX 2 MG MT GUM
2.0000 mg | CHEWING_GUM | OROMUCOSAL | Status: DC | PRN
  Administered 2012-08-11 – 2012-08-16 (×8): 2 mg via ORAL
  Filled 2012-08-11: qty 1

## 2012-08-11 MED ORDER — ALUM & MAG HYDROXIDE-SIMETH 200-200-20 MG/5ML PO SUSP: 30.0000 mL | ORAL | Status: DC | PRN

## 2012-08-11 MED ORDER — ACETAMINOPHEN 325 MG PO TABS
650.0000 mg | ORAL_TABLET | Freq: Four times a day (QID) | ORAL | Status: DC | PRN
  Administered 2012-08-12: 650 mg via ORAL

## 2012-08-11 MED ORDER — LORAZEPAM 1 MG PO TABS: 1.0000 mg | ORAL_TABLET | Freq: Two times a day (BID) | ORAL | Status: DC

## 2012-08-11 MED ORDER — RISPERIDONE 2 MG PO TABS: 2.0000 mg | ORAL_TABLET | Freq: Two times a day (BID) | ORAL | Status: DC

## 2012-08-11 MED ORDER — LORAZEPAM 2 MG/ML IJ SOLN: 1.0000 mg | Freq: Four times a day (QID) | INTRAMUSCULAR | Status: DC | PRN

## 2012-08-11 MED ORDER — ZIPRASIDONE MESYLATE 20 MG IM SOLR: 10.0000 mg | Freq: Four times a day (QID) | INTRAMUSCULAR | Status: DC | PRN

## 2012-08-11 NOTE — ED Notes (Signed)
[employer] here to see, 2 bags of belongings sent w/ pt

## 2012-08-11 NOTE — ED Notes (Addendum)
Dr jacobowitz into see  

## 2012-08-11 NOTE — ED Notes (Signed)
Up in room, watching tv 

## 2012-08-11 NOTE — ED Notes (Signed)
Up to the bathroom 

## 2012-08-11 NOTE — ED Notes (Signed)
telepsych in progress 
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
## 2012-08-11 NOTE — ED Notes (Signed)
Up in room, making his bed

## 2012-08-11 NOTE — ED Notes (Signed)
Patient was covering his head with pillow and was informed to remove pillow form head. Patient then covered his head with his blanket. Patient was asked once a again to remove blanket from head. Patient then put bedside table in front of door. Security was called to talk with patient. Patient the proceeded to go to bathroom. Camera's was turned to bathroom and patient got in shower. This is patient 3rd time taking a shower. Patient also knows he is not to take showers at this time of night. Patient was asked to get out of shower. With hesitation patient got out of shower. Patient stood with door open drying his self off. Patient was given clean paper scrubs to put on and patient went to his room. Patient was given 20 mg of geodon IM with no problems.

## 2012-08-11 NOTE — ED Notes (Signed)
Laying quietly in bed.

## 2012-08-11 NOTE — ED Provider Notes (Addendum)
Psychiatric consultation reviewed patient will require psychiatric admission due to risk of harm to others  Doug Sou, MD 08/11/12 1648  455 pm pt alert cooperative amnulatoy , stable for transfer to [facility]  Doug Sou, MD 08/11/12 1700

## 2012-08-11 NOTE — Progress Notes (Signed)
Patient did attend the evening speaker AA meeting.  

## 2012-08-11 NOTE — Tx Team (Signed)
Initial Interdisciplinary Treatment Plan  PATIENT STRENGTHS: (choose at least two) Active sense of humor Average or above average intelligence Communication skills General fund of knowledge Motivation for treatment/growth Work skills  PATIENT STRESSORS: Financial difficulties Loss of Place to live* Marital or family conflict Medication change or noncompliance Occupational concerns   PROBLEM LIST: Problem List/Patient Goals Date to be addressed Date deferred Reason deferred Estimated date of resolution  Homeless 08/12/2012     Anti-social behavior @ home  08/11/12    Noncompliance with medication or treatment 08/11/2012                                          DISCHARGE CRITERIA:  Ability to meet basic life and health needs Adequate post-discharge living arrangements Improved stabilization in mood, thinking, and/or behavior Motivation to continue treatment in a less acute level of care Need for constant or close observation no longer present Safe-care adequate arrangements made Verbal commitment to aftercare and medication compliance  PRELIMINARY DISCHARGE PLAN: Placement in alternative living arrangements Return to previous work or school arrangements  PATIENT/FAMIILY INVOLVEMENT: This treatment plan has been presented to and reviewed with the patient, Isaac Cabrera, and/or family member.  The patient and family have been given the opportunity to ask questions and make suggestions.  Isaac Cabrera 08/11/2012, 07:00PM

## 2012-08-11 NOTE — ED Provider Notes (Signed)
Pt resting comfortably.  No current issues.  Awaiting transfer to [facility]  Rolan Bucco, MD 08/11/12 [phone]

## 2012-08-11 NOTE — ED Notes (Signed)
Patient returning to room from bathroom.

## 2012-08-11 NOTE — ED Notes (Addendum)
Linens changed.

## 2012-08-11 NOTE — ED Notes (Signed)
telepsych info faxed and called 

## 2012-08-11 NOTE — ED Notes (Addendum)
Alert/pleasent, talking to his self, apolegetic for last night and  reports he "just got a little aggitated"

## 2012-08-12 ENCOUNTER — Encounter (HOSPITAL_COMMUNITY): Payer: Self-pay | Admitting: Psychiatry

## 2012-08-12 DIAGNOSIS — F39 Unspecified mood [affective] disorder: Secondary | ICD-10-CM | POA: Diagnosis present

## 2012-08-12 DIAGNOSIS — F259 Schizoaffective disorder, unspecified: Principal | ICD-10-CM

## 2012-08-12 DIAGNOSIS — F22 Delusional disorders: Secondary | ICD-10-CM | POA: Diagnosis present

## 2012-08-12 MED ORDER — QUETIAPINE FUMARATE 100 MG PO TABS
100.0000 mg | ORAL_TABLET | Freq: Every day | ORAL | Status: DC
  Administered 2012-08-12 – 2012-08-13 (×2): 100 mg via ORAL
  Filled 2012-08-12 (×3): qty 1
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
## 2012-08-12 NOTE — Progress Notes (Signed)
Psychoeducational Group Note  Date:  08/12/2012 Time:  1100  Group Topic/Focus:  Wellness Toolbox:   The focus of this group is to discuss various aspects of wellness, balancing those aspects and exploring ways to increase the ability to experience wellness.  Patients will create a wellness toolbox for use upon discharge.  Participation Level:  Did Not Attend  Participation Quality:    Affect:    Cognitive:    Insight:    Engagement in Group:    Additional Comments: pt was in the shower, did not attend.  Isla Pence M 08/12/2012, 12:31 PM

## 2012-08-12 NOTE — Discharge Planning (Signed)
Isaac Cabrera attended AM group.  Mood good.  Tends to interrupt, which is aggravating to some patients, especially one peer, and Isaac Cabrera knows what to say to push her buttons.  States he is here to get a job and find housing.  No insight.  States he cannot stay with family.  "I burned that bridge."  Let him know I cannot get him into housing or get him a job.  He is agreeable to referral to shelter.  Says he is unable to return to [facility].  Open to shelter at [facility].  Does not appear to be overly concerned with disposition.   Isaac Cabrera attended PM group, the theme of which was Wellness.  He declined to participate in the stretching exercise, but was not disruptive.  Declined to participate in guided imagery, but was again not disruptive.  Significant amount of laughing to self.  Appears to be responding to internal stimuli.

## 2012-08-12 NOTE — Treatment Plan (Signed)
Interdisciplinary Treatment Plan Update (Adult)  Date: 08/12/2012  Time Reviewed: 1:06 PM   Progress in Treatment: Attending groups: Yes Participating in groups: Yes Taking medication as prescribed: Yes Tolerating medication: Yes   Family/Significant other contact made:  No Patient understands diagnosis:  No  Limited insight  States he is here due to homelessness and fight with family Discussing patient identified problems/goals with staff:  Yes  See below Medical problems stabilized or resolved:  Yes Denies suicidal/homicidal ideation: Yes  In tx team Issues/concerns per patient self-inventory:  None  "Get me out of here to a place where I can live and they will help me get identification, work, etc." Other:  New problem(s) identified: N/A  Reason for Continuation of Hospitalization: Hallucinations Medication stabilization  Interventions implemented related to continuation of hospitalization: Seroquel trial  Encourage group attendance and participation  Additional comments:  Estimated length of stay: 2-4 days  Discharge Plan: currently unknown New goal(s): N/A  Review of initial/current patient goals per problem list:   1.  Goal(s):Eliminate psychosis  Met:  No  Target date: 11/14  As evidenced by: observation  Currently [name] appears to be responding to internal stimuli  2.  Goal (s): Identify living situation  Met:  No  Target date:11/14  As evidenced [id number] of bed  3.  Goal(s): Refer for outpt services  Met:  No  Target date:11/14  As evidenced [id number] of appointment  4.  Goal(s):  Met:  No  Target date:  As evidenced by:  Attendees: Patient:  Isaac Cabrera 08/12/2012 1:06 PM  Family:     Physician:  Thedore Mins 08/12/2012 1:06 PM   Nursing: Joslyn Devon   08/12/2012 1:06 PM   Clinical Social Worker:  Richelle Ito 08/12/2012 1:06 PM   Extender:  Verne Spurr PA 08/12/2012 1:06 PM   Other:     Other:     Other:       Other:      Scribe for Treatment Team:   Daryel Gerald B, 08/12/2012 1:06 PM

## 2012-08-12 NOTE — [facility] Suicide Risk Assessment (Signed)
Suicide Risk Assessment  Admission Assessment     Nursing information obtained from:  Patient Demographic factors:  Male;Adolescent or young adult;Low socioeconomic status Current Mental Status:  NA Loss Factors:  Loss of significant relationship;Financial problems / change in socioeconomic status Historical Factors:    Risk Reduction Factors:  NA  CLINICAL FACTORS:   agitated mood and delusional thinking  COGNITIVE FEATURES THAT CONTRIBUTE TO RISK:  Closed-mindedness Polarized thinking    SUICIDE RISK:   Minimal: No identifiable suicidal ideation.  Patients presenting with no risk factors but with morbid ruminations; may be classified as minimal risk based on the severity of the depressive symptoms  PLAN OF CARE:1. Admit for crisis management and stabilization. 2. Medication management to reduce current symptoms to base line and improve the patient's overall level of functioning 3. Treat health problems as indicated. 4. Develop treatment plan to decrease risk of relapse upon discharge and the need for readmission. 5. Psycho-social education regarding relapse prevention and self care. 6. Health care follow up as needed for medical problems. 7. Restart home medications where appropriate.     Thedore Mins, MD 08/12/2012, 10:54 AM

## 2012-08-12 NOTE — Progress Notes (Signed)
Psychoeducational Group Note  Date:  08/12/2012 Time:  2000  Group Topic/Focus:  Wrap-Up Group:   The focus of this group is to help patients review their daily goal of treatment and discuss progress on daily workbooks.  Participation Level:  Active  Participation Quality:  Intrusive  Affect:  Excited  Cognitive:  Appropriate  Insight:  Good  Engagement in Group:  Good  Additional Comments:  Patient attended and participated in group tonight. He reports having a great day. He went off the unit to the gym, attended his groups and sleep. The patient reports that he take care of his wellness by exercising and he is planning to attend school.  [name] [facility] 08/12/2012, 9:20 PM

## 2012-08-12 NOTE — Progress Notes (Signed)
D: In hallway on approach. Appears bright. Open and spontaneous in conversation. Calm and cooperative with assessment. No acute distress noted. States he had had a good day. States he attended groups and enjoyed them but he could not provide any content he learned. States he is feeling better and is eager for D/C but states he needs a lot of things before he can get where he needs to (ID, SS card, etc.). States he has been working with his CM about options but he wants to stay in [place] if it is possible. Denies SI/HI/AVH and contracts for safety. Did request prn for sleep. Otherwise offered no questions or concerns.  A: Safety has been maintained with Q15 min observation. POC and medications for the shift reviewed and understanding verbalized. Meds given as ordered by MD. PRN provided for sleep.  R: Remains safe. Is calm and cooperative. He is interacting appropriately with peers and offers no questions or concerns. He states he feels like he is stable for DC and is hopeful to get a DCP in place soon. Will f/u response to prn, continue Q15 min obs and continue current POC.

## 2012-08-12 NOTE — Progress Notes (Signed)
Patient ID: Isaac Cabrera, male   DOB: Jun 11, 1986, 26 y.o.   MRN: [id number] Patient attended treatment team meeting; presented with bright affect.  He was guarded with his answers.  He stated the disagreement with his parents was "private" and would not give any specifics.  He stated he would like to find some "transitional housing" and eventually find a job.  He has been living on the streets.  He did not indicate that he would take any medication after being discharged from [facility] or follow up with mental health.  Patient stated that he was brought in by police after they said "they would help me."  Patient denies any SI/HI/AVH.  He denies any depression.  He presents with a bright affect, laughing and joking with flirtatious mannerisms.    Continue to monitor medication management and MD orders.  Collaborate with treatment team members regarding patient's POC.  15 minute safety checks continued per protocol.    Patient's behavior has been appropriate on the unit.  He has attended groups with minimal participation.

## 2012-08-13 NOTE — Progress Notes (Deleted)
Adult Psychosocial Assessment Update Interdisciplinary Team  Previous [facility] admissions/discharges:  Admissions Discharges  Date: Date:  Date: Date:  Date: Date:  Date: Date:  Date: Date:   Changes since the last Psychosocial Assessment (including adherence to outpatient mental health and/or substance abuse treatment, situational issues contributing to decompensation and/or relapse).              Discharge Plan 1. Will you be returning to the same living situation after discharge?   Yes: No:      If no, what is your plan?           2. Would you like a referral for services when you are discharged? Yes:     If yes, for what services?  No:              Summary and Recommendations (to be completed by the evaluator)                        Signature:  Isaac Cabrera, 08/13/2012 3:52 PM

## 2012-08-13 NOTE — Progress Notes (Signed)
Resting in bed with eyes closed.  No distress observed.  Safety maintained with q15 minute checks.

## 2012-08-13 NOTE — Progress Notes (Signed)
Patient ID: Isaac Cabrera, male   DOB: December 03, 1985, 26 y.o.   MRN: [id number]  D: Patient appears  depressed. Calm and cooperative with assessment. No acute distressed noted. Denies SI/HI/AV and contract for safety. Offered no additional question or concerns  A: safety has been maintained with Q15 minutes observation. Support and encouragement provided  Scheduled medication given.  R: Patient remains safe.  he is complaint with medication and group programming. Safety has been maintained Q15 and continue current POC.

## 2012-08-13 NOTE — Progress Notes (Signed)
D: Patient been in bed all morning, refuse to attend groups. Patient states "I'm not going to group to listen to fake  Stories". Patient cursing, inappropriate laughing and noncompliant group therapy. Patient stated that he is not going to clean his room because his roommate doesn't clean up. Patient room not clean. Papers, trash and clothing on bedroom floor and bathroom floor. Patient irritable with roommate. Patient denies SI/HI/AVH. Patient denies pain and show no s/s of distress. A: Patient encouraged to attend groups. Verbal support given. Shift assessment completed. 15 minute checks performed for safety. R:Patient irritable. Noncompliant. In bed resting safety. Patient remains safe.

## 2012-08-14 DIAGNOSIS — F22 Delusional disorders: Secondary | ICD-10-CM

## 2012-08-14 MED ORDER — QUETIAPINE FUMARATE ER 200 MG PO TB24
200.0000 mg | ORAL_TABLET | Freq: Every day | ORAL | Status: DC
  Administered 2012-08-14 – 2012-08-15 (×2): 200 mg via ORAL
  Filled 2012-08-14 (×4): qty 1

## 2012-08-14 MED ORDER — DIVALPROEX SODIUM 500 MG PO DR TAB
500.0000 mg | DELAYED_RELEASE_TABLET | Freq: Every day | ORAL | Status: DC
  Administered 2012-08-14 – 2012-08-16 (×3): 500 mg via ORAL
  Filled 2012-08-14 (×6): qty 1

## 2012-08-14 NOTE — Progress Notes (Signed)
Psychoeducational Group Note  Date:  08/14/2012 Time:  2000  Group Topic/Focus:  Wrap-Up Group:   The focus of this group is to help patients review their daily goal of treatment and discuss progress on daily workbooks.  Participation Level: Did Not Attend  Participation Quality:  Not Applicable  Affect:  Not Applicable  Cognitive:  Not Applicable  Insight:  Not Applicable  Engagement in Group: Not Applicable  Additional Comments:  Pt did not attend  Christ Kick 08/14/2012, 12:17 AM

## 2012-08-14 NOTE — Clinical Social Work Psych Note (Signed)
Pt did not attend discharge planning group.   

## 2012-08-14 NOTE — Progress Notes (Signed)
Psychoeducational Group Note  Date:  08/14/2012 Time:  1100  Group Topic/Focus:  Personal Choices and Values:   The focus of this group is to help patients assess and explore the importance of values in their lives, how their values affect their decisions, how they express their values and what opposes their expression.  Participation Level:  Did Not Attend  Participation Quality:    Affect:    Cognitive:    Insight:    Engagement in Group:    Additional Comments: pt was in the shower and then treatment team. Pt did not attend.  Isla Pence M 08/14/2012, 5:23 PM

## 2012-08-14 NOTE — Progress Notes (Signed)
Patient ID: Isaac Cabrera, male   DOB: 1986-06-21, 26 y.o.   MRN: [id number] [facility] MD Progress Note  08/14/2012 11:43 AM Isaac Cabrera  MRN:  [id number]  Diagnosis:  Delusional disorder   ADL's:  Intact  Sleep: impaired patient is sleeping "all day and all night."  Appetite:  Fair  Suicidal Ideation:  denies Homicidal Ideation:  denies  AEB (as evidenced by): Subjective: Met with patient in treatment team today. He was rude to the interviewer for housing placement and the interview did not get completed. He is not attending groups and is sleeping most of the day.  Mental Status Examination/Evaluation: Objective:  Appearance: Disheveled  Eye Contact::  Good  Speech:  Clear and Coherent  Volume:  Normal  Mood:  Euthymic  Affect:  Labile  Thought Process:  Disorganized  Orientation:  Other:    Thought Content:  Delusions  Suicidal Thoughts:  No  Homicidal Thoughts:  No  Memory:  Immediate;   Fair Recent;   Fair Remote;   Fair  Judgement:  Impaired  Insight:  Lacking  Psychomotor Activity:  Normal  Concentration:  Fair  Recall:  Fair  Akathisia:  No  Handed:    AIMS (if indicated):     Assets:  Physical Health  Sleep:  Number of Hours: 6.75    Vital Signs:Blood pressure 138/77, pulse 73, temperature 97 F (36.1 C), temperature source Oral, resp. rate 20, height 6' 2.5" (1.892 m), weight 73.936 kg (163 lb). Current Medications: Current Facility-Administered Medications  Medication Dose Route Frequency Provider Last Rate Last Dose  . acetaminophen (TYLENOL) tablet 650 mg  650 mg Oral Q6H PRN Shuvon Rankin, NP   650 mg at 08/12/12 2157  . alum & mag hydroxide-simeth (MAALOX/MYLANTA) 200-200-20 MG/5ML suspension 30 mL  30 mL Oral Q4H PRN Shuvon Rankin, NP      . divalproex (DEPAKOTE) DR tablet 500 mg  500 mg Oral QHS Verne Spurr, PA-C      . magnesium hydroxide (MILK OF MAGNESIA) suspension 30 mL  30 mL Oral Daily PRN Shuvon Rankin, NP      . nicotine polacrilex  (NICORETTE) gum 2 mg  2 mg Oral PRN Verne Spurr, PA-C   2 mg at 08/13/12 1947  . QUEtiapine (SEROQUEL XR) 24 hr tablet 200 mg  200 mg Oral Daily Verne Spurr, PA-C      . traZODone (DESYREL) tablet 50 mg  50 mg Oral QHS PRN Verne Spurr, PA-C   50 mg at 08/13/12 2149  . [DISCONTINUED] QUEtiapine (SEROQUEL) tablet 100 mg  100 mg Oral QHS Mojeed Akintayo   100 mg at 08/13/12 2145    Lab Results: No results found for this or any previous visit (from the past 48 hour(s)).  Physical Findings: AIMS: Facial and Oral Movements Muscles of Facial Expression: None, normal Lips and Perioral Area: None, normal Jaw: None, normal Tongue: None, normal,Extremity Movements Upper (arms, wrists, hands, fingers): None, normal Lower (legs, knees, ankles, toes): None, normal, Trunk Movements Neck, shoulders, hips: None, normal, Overall Severity Severity of abnormal movements (highest score from questions above): None, normal Incapacitation due to abnormal movements: None, normal Patient's awareness of abnormal movements (rate only patient's report): No Awareness, Dental Status Current problems with teeth and/or dentures?: No Does patient usually wear dentures?: No  CIWA:  CIWA-Ar Total: 0  COWS:  COWS Total Score: 0   Treatment Plan Summary: Daily contact with patient to assess and evaluate symptoms and progress in treatment Medication management  Plan: 1.  Continue the current plan of care as written. 2. He will be placed on the state hospital list. 3. CM will arrange for another interview for his placement.  Rona Ravens. Isaac Cabrera [facility] 08/14/2012 11:51 AM

## 2012-08-14 NOTE — Progress Notes (Signed)
[facility] MD Progress Note  08/13/2012 Isaac Cabrera  MRN:  [id number]  Diagnosis: Delusional disorder  ADL's:  Intact  Sleep: Good  Appetite:  Fair  Suicidal Ideation:  denies Homicidal Ideation:  denies  AEB (as evidenced by): Subjective: Met with the patient in his room, he was still in bed. He notes that he was very sleepy and could not attend groups. He states the medication makes him sleepy. Mental Status Examination/Evaluation: Objective:  Appearance: Disheveled  Eye Contact::  Minimal  Speech:  Normal Rate  Volume:  Normal  Mood:  Euthymic  Affect:  Non-Congruent  Thought Process:  Disorganized  Orientation:  Other:    Thought Content:  Delusions  Suicidal Thoughts:  No  Homicidal Thoughts:  No  Memory:  Immediate;   Fair Recent;   Fair Remote;   Fair  Judgement:  Impaired  Insight:  Lacking  Psychomotor Activity:  Normal  Concentration:  Poor  Recall:  Poor  Akathisia:  No  Handed:    AIMS (if indicated):     Assets:  Physical Health Resilience  Sleep:  Number of Hours: 6.75    Vital Signs:Blood pressure 138/77, pulse 73, temperature 97 F (36.1 C), temperature source Oral, resp. rate 20, height 6' 2.5" (1.892 m), weight 73.936 kg (163 lb). Current Medications: Current Facility-Administered Medications  Medication Dose Route Frequency Provider Last Rate Last Dose  . acetaminophen (TYLENOL) tablet 650 mg  650 mg Oral Q6H PRN Shuvon Rankin, NP   650 mg at 08/12/12 2157  . alum & mag hydroxide-simeth (MAALOX/MYLANTA) 200-200-20 MG/5ML suspension 30 mL  30 mL Oral Q4H PRN Shuvon Rankin, NP      . divalproex (DEPAKOTE) DR tablet 500 mg  500 mg Oral QHS Verne Spurr, PA-C      . magnesium hydroxide (MILK OF MAGNESIA) suspension 30 mL  30 mL Oral Daily PRN Shuvon Rankin, NP      . nicotine polacrilex (NICORETTE) gum 2 mg  2 mg Oral PRN Verne Spurr, PA-C   2 mg at 08/13/12 1947  . QUEtiapine (SEROQUEL XR) 24 hr tablet 200 mg  200 mg Oral Daily Verne Spurr,  PA-C      . traZODone (DESYREL) tablet 50 mg  50 mg Oral QHS PRN Verne Spurr, PA-C   50 mg at 08/13/12 2149  . [DISCONTINUED] QUEtiapine (SEROQUEL) tablet 100 mg  100 mg Oral QHS Mojeed Akintayo   100 mg at 08/13/12 2145    Lab Results: No results found for this or any previous visit (from the past 48 hour(s)).  Physical Findings: AIMS: Facial and Oral Movements Muscles of Facial Expression: None, normal Lips and Perioral Area: None, normal Jaw: None, normal Tongue: None, normal,Extremity Movements Upper (arms, wrists, hands, fingers): None, normal Lower (legs, knees, ankles, toes): None, normal, Trunk Movements Neck, shoulders, hips: None, normal, Overall Severity Severity of abnormal movements (highest score from questions above): None, normal Incapacitation due to abnormal movements: None, normal Patient's awareness of abnormal movements (rate only patient's report): No Awareness, Dental Status Current problems with teeth and/or dentures?: No Does patient usually wear dentures?: No  CIWA:  CIWA-Ar Total: 0  COWS:  COWS Total Score: 0   Treatment Plan Summary: Daily contact with patient to assess and evaluate symptoms and progress in treatment Medication management  Plan: 1. Continue the current plan of care with no changes at this time. Rona Ravens. Santina Trillo

## 2012-08-14 NOTE — Progress Notes (Signed)
D:  Patient denies SI/HI/AVH. Patient denies pain and show no s/s of distres. During treatment, Patient was noncompliant, loud, cursing at staff, brief eye contact, and anxious. Patient presented with poor hygiene. Patient threaten to act out on the unit in order to continue to have a bed to sleep in and meals.  A: Patient redirected. Verbal support given. 15 minute checks performed for safety. Patient encouraged to attend groups. 15 minute checks performed for safety. R: Noncompliant. Irritable. Anxious. Reports sleeping all morning and night.

## 2012-08-14 NOTE — Clinical Social Work Psych Note (Signed)
Interdisciplinary Treatment Plan Update (Adult)  Date:  08/14/2012  Time Reviewed:  10:18 AM   Progress in Treatment: Attending groups: Yes Participating in groups:  Yes Taking medication as prescribed:  Yes Tolerating medication: Yes Family/Significant othe contact made:  Counselor assessing for appropriate contact Patient understands diagnosis:  Yes Discussing patient identified problems/goals with staff:  Yes Medical problems stabilized or resolved:  Yes Denies suicidal/homicidal ideation: Yes Issues/concerns per patient self-inventory:  No Other:  New problem(s) identified: None identified  Reason for Continuation of Hospitalization: Aggression  Interventions implemented related to continuation of hospitalization: mood stabilization, medication monitoring and adjustment, group therapy and psycho education, safety checks q 15 mins   Additional comments: N/A  Estimated length of stay: 3-5 days  Discharge Plan: SW assessing for appropriate referrals.  New goal(s): N/A  Review of initial/current patient goals per problem list:   1.  Goal(s): Depression and anxiety  Met:  Yes  Target date: by discharge  As evidenced by: Reducing depression from a 10 to a 3 as reported by pt.   2.  Goal (s): Hallucinations and/or delusions  Met:  Yes  Target date: by discharge  As evidenced by: Reduce psychosis to baseline. 3.  Goal(s): Suicidal Ideation  Met:  Yes  Target date: by discharge  As evidenced by: Eliminate SI   Attendees: Patient:  Isaac Cabrera   Family:     Physician:  Lindaann Slough, MD 08/14/2012 10:18 AM   Nursing: Liborio Nixon, RN 08/14/2012 10:18 AM   Clinical Social Worker: Estevan Ryder, LCSWA 08/14/2012 10:18 AM   Extender: Verne Spurr, PA 08/14/2012 10:18 AM   Other:  08/14/2012 10:18 AM   Other:   08/14/2012 10:18 AM   Other:     Other:      Scribe for Treatment Team:   Calvert Cantor, 08/14/2012 10:18 AM

## 2012-08-14 NOTE — Progress Notes (Signed)
Psychoeducational Group Note  Date:  08/14/2012 Time:  2000  Group Topic/Focus:  Wrap-Up Group:   The focus of this group is to help patients review their daily goal of treatment and discuss progress on daily workbooks.  Participation Level:  Did Not Attend  Participation Quality:    Affect:    Cognitive:    Insight:    Engagement in Group:    Additional Comments: Patient remained in bed, awake, and in room for the duration of wrap-up group tonight. Novis League, Newton Pigg 08/14/2012, 10:45 PM

## 2012-08-15 MED ORDER — QUETIAPINE FUMARATE ER 300 MG PO TB24
300.0000 mg | ORAL_TABLET | Freq: Every day | ORAL | Status: DC
  Administered 2012-08-16: 300 mg via ORAL
  Filled 2012-08-15 (×2): qty 1

## 2012-08-15 NOTE — Progress Notes (Signed)
[facility] MD Progress Note  08/15/2012 2:48 PM Isaac Cabrera  MRN:  [id number]  Diagnosis:  Delusional disorder ADL's:  Intact  Sleep: Good patient sleep 6 hours last night and has been in bed most of today.  Appetite:  Good  Suicidal Ideation:  denies Homicidal Ideation:  denies  AEB (as evidenced by): Subjective: Met with Isaac Cabrera 1:1 today. He was observed up on the unit miliue. He has not attended any groups. He states he wants and likes to sleep all day and all night.  He does not know whether the SW has come by for evaluation of him a second time as he was so rude the first time.  He stated that his right elbow was tender.  Elbow was examined and found to be in FROM w/no lesions, edema, discoloration, or other abnormality.  Mental Status Examination/Evaluation: Objective:  Appearance: Disheveled  Eye Contact::  Fair  Speech:  Clear and Coherent  Volume:  Normal  Mood:  Irritable  Affect:  Congruent  Thought Process:  Circumstantial  Orientation:  Full  Thought Content:  WDL  Suicidal Thoughts:  No  Homicidal Thoughts:  No  Memory:  Immediate;   Fair Recent;   Fair Remote;   Fair  Judgement:  Impaired  Insight:  Lacking  Psychomotor Activity:  Normal  Concentration:  Poor  Recall:  Poor  Akathisia:  No  Handed:    AIMS (if indicated):     Assets:  Physical Health  Sleep:  Number of Hours: 6    Vital Signs:Blood pressure 131/85, pulse 86, temperature 98.5 F (36.9 C), temperature source Oral, resp. rate 18, height 6' 2.5" (1.892 m), weight 73.936 kg (163 lb). Current Medications: Current Facility-Administered Medications  Medication Dose Route Frequency Provider Last Rate Last Dose  . acetaminophen (TYLENOL) tablet 650 mg  650 mg Oral Q6H PRN Shuvon Rankin, NP   650 mg at 08/12/12 2157  . alum & mag hydroxide-simeth (MAALOX/MYLANTA) 200-200-20 MG/5ML suspension 30 mL  30 mL Oral Q4H PRN Shuvon Rankin, NP      . divalproex (DEPAKOTE) DR tablet 500 mg  500 mg Oral QHS  Verne Spurr, PA-C   500 mg at 08/14/12 2115  . magnesium hydroxide (MILK OF MAGNESIA) suspension 30 mL  30 mL Oral Daily PRN Shuvon Rankin, NP      . nicotine polacrilex (NICORETTE) gum 2 mg  2 mg Oral PRN Verne Spurr, PA-C   2 mg at 08/14/12 2115  . QUEtiapine (SEROQUEL XR) 24 hr tablet 300 mg  300 mg Oral Daily Verne Spurr, PA-C      . traZODone (DESYREL) tablet 50 mg  50 mg Oral QHS PRN Verne Spurr, PA-C   50 mg at 08/13/12 2149  . [DISCONTINUED] QUEtiapine (SEROQUEL XR) 24 hr tablet 200 mg  200 mg Oral Daily Verne Spurr, PA-C   200 mg at 08/15/12 4098    Lab Results: No results found for this or any previous visit (from the past 48 hour(s)).  Physical Findings: AIMS: Facial and Oral Movements Muscles of Facial Expression: None, normal Lips and Perioral Area: None, normal Jaw: None, normal Tongue: None, normal,Extremity Movements Upper (arms, wrists, hands, fingers): None, normal Lower (legs, knees, ankles, toes): None, normal, Trunk Movements Neck, shoulders, hips: None, normal, Overall Severity Severity of abnormal movements (highest score from questions above): None, normal Incapacitation due to abnormal movements: None, normal Patient's awareness of abnormal movements (rate only patient's report): No Awareness, Dental Status Current problems with teeth and/or dentures?:  No Does patient usually wear dentures?: No  CIWA:  CIWA-Ar Total: 0  COWS:  COWS Total Score: 0   Treatment Plan Summary: Daily contact with patient to assess and evaluate symptoms and progress in treatment Medication management  Plan: 1. Increased Seroquel to 300mg  at 1800pm. 2. Continued Depakote 500mg  at hs. 3. Will discontinue Trazodone as the patient is sleeping extremely well. 4. CM will continue search for placement. 5. His lack of improvement places him at risk for higher level of care. Isaac Cabrera PAC   Mekiah Wahler 08/15/2012, 2:48 PM
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
## 2012-08-15 NOTE — Clinical Social Work Note (Signed)
Pt did  Not attend discharge planning group.

## 2012-08-15 NOTE — Progress Notes (Signed)
Pt observed in his room in bed.  He easily responded to his name.  He reports he is very tired and sleepy.  The pt did not attend group this evening. This Clinical research associate talks with pt about how the previous two evening he was up and interacting in the dayroom during evening activities.  He says he feels it is the "coffee" that "brings him up and then he crashes".  He denied SI/HI/AV.  His only request was for nicorette gum.  He has been med compliant.  He makes his needs known to staff.  Discharge plans are incomplete as pt is homeless.  Safety maintained with 15 minute checks.

## 2012-08-15 NOTE — Clinical Social Work Note (Signed)
[facility] Group Notes:  (Counselor/Nursing/MHT/Case Management/Adjunct)  08/15/2012 2:20 PM  Type of Therapy: Process Group Therapy  Participation Level:  Did Not Attend    Summary of Progress/Problems: .balance: The topic for group was balance in life.  Pt participated in the discussion about when their life was in balance and out of balance and how this feels.  Pt discussed ways to get back in balance and short term goals they can work on to get where they want to be.    Daryel Gerald B 08/15/2012, 2:20 PM

## 2012-08-15 NOTE — Progress Notes (Signed)
Patient ID: Isaac Cabrera, male   DOB: September 23, 1986, 26 y.o.   MRN: [id number]  D: Pt denies SI/HI/AV. Pt is pleasant and cooperative. Pt requested nicorette gum. Pt says he went to all the groups today and they have been helping.  A: Pt was offered support and encouragement. Pt was given scheduled medications. Pt was encourage to attend groups. Q 15 minute checks were done for safety. Pt given nicorette gum.  R:Pt attends groups and interacts well with peers and staff. Pt is taking medication. Pt has no complaints.Pt receptive to treatment and safety maintained on unit. Nicorette gum effective.

## 2012-08-15 NOTE — Progress Notes (Signed)
[facility] Group Notes:  (Counselor/Nursing/MHT/Case Management/Adjunct)  08/15/2012 2:51 PM  Type of Therapy:  Psychoeducational Skills  Participation Level:  Minimal  Participation Quality:  Attentive  Affect:  Labile  Cognitive:  Confused  Insight:  Limited  Engagement in Group:  Good  Engagement in Therapy:  n/a  Modes of Intervention:  Activity, Education, Limit-setting, Socialization and Support  Summary of Progress/Problems: Tamarius came late but did attend psychoeducation group and where group was asked to participate in activity sharing one goal for the day, one thing the is grateful for and one things that makes them unique. [name] because agitated with peer and perceived that peer was not listening to him, Muaz was easily redirected. Donato participated in an activity writing down what he is grateful for on a leaf and hanging it on the hall "tree".    Wandra Scot 08/15/2012, 2:51 PM

## 2012-08-16 MED ORDER — QUETIAPINE FUMARATE ER 400 MG PO TB24
400.0000 mg | ORAL_TABLET | Freq: Every day | ORAL | Status: DC
  Administered 2012-08-17 – 2012-08-19 (×3): 400 mg via ORAL
  Filled 2012-08-16 (×4): qty 1
  Filled 2012-08-16: qty 14

## 2012-08-16 NOTE — Progress Notes (Signed)
Patient ID: Isaac Cabrera, male   DOB: 08-20-1986, 26 y.o.   MRN: [id number]  D: Pt denies SI/HI/AV. Pt is pleasant and cooperative. Pt continues to look depressed / sad. Pt  hygiene continues to be disheveled. Pt in day room with another pt braiding his hair. Pt appears to have something else on his mind, but continues to deny anything is wrong.   A: Pt was offered support and encouragement. Pt was given scheduled medications. Pt was encourage to attend groups. Q 15 minute checks were done for safety.   R:Pt attends groups and interacts well with peers and staff. Pt is taking medication. Pt has no complaints.Pt receptive to treatment and safety maintained on unit.

## 2012-08-16 NOTE — Progress Notes (Signed)
Patient ID: Isaac Cabrera, male   DOB: [birth year], 27 y.o.   MRN: [id number]  Problem: Delusional  D: Patient withdrawn and quiet and with minimal interaction with staff/peers. Patient rates his depression at a 2 and his hopelessness at a 1. A: Monitor patient Q 15 minutes for safety, encourage staff/peer interaction and group participation. Administer medications as ordered by MD. R: Patient compliant with medications; no inappropriate behaviors noted at this time.

## 2012-08-16 NOTE — Progress Notes (Signed)
[facility] Group Notes:  (Counselor/Nursing/MHT/Case Management/Adjunct)  08/16/2012 3:54 PM  Type of Therapy:  Group Therapy  Participation Level:  Did Not Attend    Isaac Cabrera Renee 08/16/2012, 3:54 PM 

## 2012-08-16 NOTE — Progress Notes (Signed)
[facility] Group Notes:  (Counselor/Nursing/MHT/Case Management/Adjunct)  08/16/2012 10:07 PM  Type of Therapy:  Psychoeducational Skills  Participation Level:  Minimal  Participation Quality:  Attentive  Affect:  Appropriate  Cognitive:  Appropriate  Insight:  Good  Engagement in Group:  Limited  Engagement in Therapy:  Limited  Modes of Intervention:  Education  Summary of Progress/Problems: The patient verbalized that he had a good day today. He attributes his positive day to attending most of his groups and because he went to the gym. His goal for tomorrow is to talk to the case manager or the doctor regarding his discharge plans. He is unclear at this point as to where he will be going following discharge.    [name] S 08/16/2012, 10:07 PM

## 2012-08-16 NOTE — Progress Notes (Signed)
Patient ID: Isaac Cabrera, male   DOB: 1986-05-29, 26 y.o.   MRN: [id number] [facility] MD Progress Note  08/16/2012 11:43 AM Isaac Cabrera  MRN:  [id number]  Diagnosis:  Delusional disorder ADL's:  Intact  Sleep: Good patient sleep 6 hours last night and has been in bed most of today.  Appetite:  Good  Suicidal Ideation:  denies Homicidal Ideation:  denies  AEB (as evidenced by): Subjective: Patient remains delusional, oppositional, defiant and entitled. He has not been attending group regularly. He is requesting [facility].  He reports that his elbow is less tender today.  Mental Status Examination/Evaluation: Objective:  Appearance: Disheveled  Eye Contact::  Fair  Speech:  Clear and Coherent  Volume:  Normal  Mood:  Irritable  Affect:  Congruent  Thought Process:  Circumstantial  Orientation:  Full  Thought Content:  WDL  Suicidal Thoughts:  No  Homicidal Thoughts:  No  Memory:  Immediate;   Fair Recent;   Fair Remote;   Fair  Judgement:  Impaired  Insight:  Lacking  Psychomotor Activity:  Normal  Concentration:  Poor  Recall:  Poor  Akathisia:  No  Handed:    AIMS (if indicated):     Assets:  Physical Health  Sleep:  Number of Hours: 6.75    Vital Signs:Blood pressure 132/86, pulse 90, temperature 98 F (36.7 C), temperature source Oral, resp. rate 16, height 6' 2.5" (1.892 m), weight 73.936 kg (163 lb). Current Medications: Current Facility-Administered Medications  Medication Dose Route Frequency Provider Last Rate Last Dose  . acetaminophen (TYLENOL) tablet 650 mg  650 mg Oral Q6H PRN Shuvon Rankin, NP   650 mg at 08/12/12 2157  . alum & mag hydroxide-simeth (MAALOX/MYLANTA) 200-200-20 MG/5ML suspension 30 mL  30 mL Oral Q4H PRN Shuvon Rankin, NP      . divalproex (DEPAKOTE) DR tablet 500 mg  500 mg Oral QHS Verne Spurr, PA-C   500 mg at 08/15/12 2137  . magnesium hydroxide (MILK OF MAGNESIA) suspension 30 mL  30 mL Oral Daily PRN Shuvon  Rankin, NP      . nicotine polacrilex (NICORETTE) gum 2 mg  2 mg Oral PRN Verne Spurr, PA-C   2 mg at 08/16/12 0806  . QUEtiapine (SEROQUEL XR) 24 hr tablet 300 mg  300 mg Oral Daily Verne Spurr, PA-C   300 mg at 08/16/12 0806  . [DISCONTINUED] QUEtiapine (SEROQUEL XR) 24 hr tablet 200 mg  200 mg Oral Daily Verne Spurr, PA-C   200 mg at 08/15/12 0817  . [DISCONTINUED] traZODone (DESYREL) tablet 50 mg  50 mg Oral QHS PRN Verne Spurr, PA-C   50 mg at 08/13/12 2149    Lab Results: No results found for this or any previous visit (from the past 48 hour(s)).  Physical Findings: AIMS: Facial and Oral Movements Muscles of Facial Expression: None, normal Lips and Perioral Area: None, normal Jaw: None, normal Tongue: None, normal,Extremity Movements Upper (arms, wrists, hands, fingers): None, normal Lower (legs, knees, ankles, toes): None, normal, Trunk Movements Neck, shoulders, hips: None, normal, Overall Severity Severity of abnormal movements (highest score from questions above): None, normal Incapacitation due to abnormal movements: None, normal Patient's awareness of abnormal movements (rate only patient's report): No Awareness, Dental Status Current problems with teeth and/or dentures?: No Does patient usually wear dentures?: No  CIWA:  CIWA-Ar Total: 0  COWS:  COWS Total Score: 0   Treatment Plan Summary: Daily contact with patient to assess and evaluate  symptoms and progress in treatment Medication management  Plan: 1. Increased Seroquel to 400mg  at 1800pm. 2. Continued Depakote 500mg  at hs. 3. CM will continue search for placement. 4. His lack of improvement places him at risk for higher level of care.   Zelena Bushong,MD 08/16/2012, 11:43 AM

## 2012-08-17 MED ORDER — DIVALPROEX SODIUM 500 MG PO DR TAB
1000.0000 mg | DELAYED_RELEASE_TABLET | Freq: Every day | ORAL | Status: DC
  Administered 2012-08-17 – 2012-08-19 (×2): 1000 mg via ORAL
  Filled 2012-08-17 (×3): qty 2
  Filled 2012-08-17: qty 28
  Filled 2012-08-17 (×2): qty 2

## 2012-08-17 NOTE — Progress Notes (Signed)
D.  Pt. Labile.  Pt. Refused to talk with Clinical research associate when Clinical research associate first met with pt.  Pt.  Refused medication.  Pt. Denies SI/HI and denies A/v hallucinations. A.  Encouraged patient to take medication.  R.  Pt. Compliant and took meds.

## 2012-08-17 NOTE — Progress Notes (Signed)
D- Patient is isolative to room and sedated this shift. Refused patient inventory sheet. Stated at medication window that he "felt ok" and was not suicidal. No group attendance.  No physical complaints and no prn medications requested.  A- Encouraged group attendance and verbilization of feelings.  Continue POC and evaluation of treatment goals.  Continue 15' checks for safety. R- Remains safe on unit.

## 2012-08-17 NOTE — Progress Notes (Signed)
Patient ID: Isaac Cabrera, male   DOB: October 21, 1985, 26 y.o.   MRN: [id number] [facility] MD Progress Note  08/17/2012 3:05 PM Isaac Cabrera  MRN:  [id number]  Diagnosis:  Delusional disorder ADL's:  Intact  Sleep: Good patient sleep 6 hours last night and has been in bed most of today.  Appetite:  Good  Suicidal Ideation:  denies Homicidal Ideation:  denies  AEB (as evidenced by): Subjective: Met with Isaac Cabrera 1:1 today. He was again in bed, and does not get up accept for meals. He reports no new problems and wonders when he is going home. Mental Status Examination/Evaluation: Objective:  Appearance: Disheveled  Eye Contact::  Fair  Speech:  Clear and Coherent  Volume:  Normal  Mood:  Irritable  Affect:  Congruent  Thought Process:  Circumstantial  Orientation:  Full  Thought Content:  WDL  Suicidal Thoughts:  No  Homicidal Thoughts:  No  Memory:  Immediate;   Fair Recent;   Fair Remote;   Fair  Judgement:  Impaired  Insight:  Lacking  Psychomotor Activity:  Normal  Concentration:  Poor  Recall:  Poor  Akathisia:  No  Handed:    AIMS (if indicated):     Assets:  Physical Health  Sleep:  Number of Hours: 4.25    Vital Signs:Blood pressure 146/84, pulse 74, temperature 97.6 F (36.4 C), temperature source Oral, resp. rate 18, height 6' 2.5" (1.892 m), weight 73.936 kg (163 lb). Current Medications: Current Facility-Administered Medications  Medication Dose Route Frequency Provider Last Rate Last Dose  . acetaminophen (TYLENOL) tablet 650 mg  650 mg Oral Q6H PRN Shuvon Rankin, NP   650 mg at 08/12/12 2157  . alum & mag hydroxide-simeth (MAALOX/MYLANTA) 200-200-20 MG/5ML suspension 30 mL  30 mL Oral Q4H PRN Shuvon Rankin, NP      . divalproex (DEPAKOTE) DR tablet 1,000 mg  1,000 mg Oral QHS Verne Spurr, PA-C      . magnesium hydroxide (MILK OF MAGNESIA) suspension 30 mL  30 mL Oral Daily PRN Shuvon Rankin, NP      . nicotine polacrilex (NICORETTE) gum 2 mg  2 mg Oral PRN  Verne Spurr, PA-C   2 mg at 08/16/12 0806  . QUEtiapine (SEROQUEL XR) 24 hr tablet 400 mg  400 mg Oral Daily Mojeed Akintayo   400 mg at 08/17/12 1029  . [DISCONTINUED] divalproex (DEPAKOTE) DR tablet 500 mg  500 mg Oral QHS Verne Spurr, PA-C   500 mg at 08/16/12 2145    Lab Results: No results found for this or any previous visit (from the past 48 hour(s)).  Physical Findings: AIMS: Facial and Oral Movements Muscles of Facial Expression: None, normal Lips and Perioral Area: None, normal Jaw: None, normal Tongue: None, normal,Extremity Movements Upper (arms, wrists, hands, fingers): None, normal Lower (legs, knees, ankles, toes): None, normal, Trunk Movements Neck, shoulders, hips: None, normal, Overall Severity Severity of abnormal movements (highest score from questions above): None, normal Incapacitation due to abnormal movements: None, normal Patient's awareness of abnormal movements (rate only patient's report): No Awareness, Dental Status Current problems with teeth and/or dentures?: No Does patient usually wear dentures?: No  CIWA:  CIWA-Ar Total: 0  COWS:  COWS Total Score: 0   Treatment Plan Summary: Daily contact with patient to assess and evaluate symptoms and progress in treatment Medication management  Plan: 1. Increased Seroquel to 400mg  at 1800pm. 2. Increased Depakote 1000mg  at hs. 3. Will discontinue Trazodone as the patient is sleeping extremely well.  4. CM will continue search for placement. 5. His lack of improvement places him at risk for higher level of care.   Rona Ravens. Havish Petties PAC 08/17/2012, 3:05 PM

## 2012-08-17 NOTE — Clinical Social Work Note (Signed)
[facility] Group Notes: (Clinical Social Work)   08/17/2012  11:15-11:45am   Type of Therapy:  Group Therapy   Participation Level:  Did Not Attend    Ambrose Mantle, LCSW 08/17/2012, 12:58 PM

## 2012-08-17 NOTE — Progress Notes (Signed)
Psychoeducational Group Note  Date:  08/17/2012 Time:  1045  Group Topic/Focus:  Making Healthy Choices:   The focus of this group is to help patients identify negative/unhealthy choices they were using prior to admission and identify positive/healthier coping strategies to replace them upon discharge.  Participation Level:  Did Not Attend  Participation Quality:   Affect:    Cognitive:    Insight:    Engagement in Group:    Additional Comments:    Cresenciano Lick 08/17/2012, 11:35 AM

## 2012-08-17 NOTE — Progress Notes (Signed)
Psychoeducational Group Note  Date:  08/17/2012 Time:  2000  Group Topic/Focus:  Wrap-Up Group:   The focus of this group is to help patients review their daily goal of treatment and discuss progress on daily workbooks.  Participation Level:  Active  Participation Quality:  Appropriate  Affect:  Appropriate  Cognitive:  Appropriate  Insight:  Good  Engagement in Group:  Good  Additional Comments:  Patient attended and participated in group tonight. He reports having a great day. He slept a lot today, and socialized some. He reports not attending any groups today.  [name] [facility] 08/17/2012, 9:29 PM

## 2012-08-18 NOTE — Progress Notes (Signed)
Patient ID: Isaac Cabrera, male   DOB: 1986/09/21, 26 y.o.   MRN: [id number] D. The patient got into a verbal altercation with his roommate. He had lowered the temperature of the room to where it was extremely cold. He lashed out at his roommate telling him that he better not touch the temperature control. When staff intervened he stated he was tired of having to share a room with his roommate because he made a mess everywhere, spilled stuff all over the floor, and left the bathroom filthy.  A. Due to not having another room available that he could be moved to, the patient was offered sleeping in the quiet room for the night.  R. The patient refused moving because he felt he should not have to move when it wasn't his fault the room was disgusting. A compromise was reached regarding the temperature of the room. He fell asleep shortly afterwards without further incident.

## 2012-08-18 NOTE — Clinical Social Work Note (Signed)
[facility] Group Notes: (Clinical Social Work)   08/18/2012   11:15-11:45AM   Type of Therapy:  Group Therapy   Participation Level:  Did Not Attend    Ioanna Colquhoun Grossman-Orr, LCSW 08/18/2012, 11:56 AM     

## 2012-08-18 NOTE — Progress Notes (Signed)
[facility] MD Progress Note  08/18/2012 12:40 PM Isaac Cabrera  MRN:  [id number]  Diagnosis:  Delusional disorder ADL's:  Intact  Sleep: Good .  Appetite:  Good  Suicidal Ideation:  denies Homicidal Ideation:  denies  AEB (as evidenced by): Subjective: Patient was seen and chart reviewed. Patient has been compliant with his medication without adverse effects including extrapyramidal symptoms. Patient has been sleeping well and eating fine. Patient was appeared sitting in a chair and watching television and along with peers. Patient does not like to be disrupted or interrupted and asked me to stop asking questions. He reports no new problems and wonders when he is going home.  Mental Status Examination/Evaluation: Objective:  Appearance: Disheveled  Eye Contact::  Fair  Speech:  Clear and Coherent  Volume:  Normal  Mood:  Irritable  Affect:  Congruent  Thought Process:  Circumstantial  Orientation:  Full  Thought Content:  WDL  Suicidal Thoughts:  No  Homicidal Thoughts:  No  Memory:  Immediate;   Fair Recent;   Fair Remote;   Fair  Judgement:  Impaired  Insight:  Lacking  Psychomotor Activity:  Normal  Concentration:  Poor  Recall:  Poor  Akathisia:  No  Handed:    AIMS (if indicated):     Assets:  Physical Health  Sleep:  Number of Hours: 4    Vital Signs:Blood pressure 132/80, pulse 82, temperature 97.6 F (36.4 C), temperature source Oral, resp. rate 20, height 6' 2.5" (1.892 m), weight 163 lb (73.936 kg). Current Medications: Current Facility-Administered Medications  Medication Dose Route Frequency Provider Last Rate Last Dose  . acetaminophen (TYLENOL) tablet 650 mg  650 mg Oral Q6H PRN Shuvon Rankin, NP   650 mg at 08/12/12 2157  . alum & mag hydroxide-simeth (MAALOX/MYLANTA) 200-200-20 MG/5ML suspension 30 mL  30 mL Oral Q4H PRN Shuvon Rankin, NP      . divalproex (DEPAKOTE) DR tablet 1,000 mg  1,000 mg Oral QHS Verne Spurr, PA-C      . magnesium hydroxide  (MILK OF MAGNESIA) suspension 30 mL  30 mL Oral Daily PRN Shuvon Rankin, NP      . nicotine polacrilex (NICORETTE) gum 2 mg  2 mg Oral PRN Verne Spurr, PA-C   2 mg at 08/16/12 0806  . QUEtiapine (SEROQUEL XR) 24 hr tablet 400 mg  400 mg Oral Daily Mojeed Akintayo   400 mg at 08/17/12 1029  . [DISCONTINUED] divalproex (DEPAKOTE) DR tablet 500 mg  500 mg Oral QHS Verne Spurr, PA-C   500 mg at 08/16/12 2145    Lab Results: No results found for this or any previous visit (from the past 48 hour(s)).  Physical Findings: AIMS: Facial and Oral Movements Muscles of Facial Expression: None, normal Lips and Perioral Area: None, normal Jaw: None, normal Tongue: None, normal,Extremity Movements Upper (arms, wrists, hands, fingers): None, normal Lower (legs, knees, ankles, toes): None, normal, Trunk Movements Neck, shoulders, hips: None, normal, Overall Severity Severity of abnormal movements (highest score from questions above): None, normal Incapacitation due to abnormal movements: None, normal Patient's awareness of abnormal movements (rate only patient's report): No Awareness, Dental Status Current problems with teeth and/or dentures?: No Does patient usually wear dentures?: No  CIWA:  CIWA-Ar Total: 0  COWS:  COWS Total Score: 0   Treatment Plan Summary: Daily contact with patient to assess and evaluate symptoms and progress in treatment Medication management  Plan: 1. Continue current treatment plan and medication management. 2. No medication changes  made during this visit 3. Case Manager will continue search for placement.  Leata Mouse, M.D. 08/18/2012, 12:40 PM

## 2012-08-18 NOTE — Progress Notes (Signed)
Psychoeducational Group Note  Date:  08/18/2012 Time:  0945 am  Group Topic/Focus:  Making Healthy Choices:   The focus of this group is to help patients identify negative/unhealthy choices they were using prior to admission and identify positive/healthier coping strategies to replace them upon discharge.  Participation Level:  Did Not Attend    Andrena Mews 08/18/2012, 10:29 AM

## 2012-08-18 NOTE — Progress Notes (Addendum)
Patient ID: Isaac Cabrera, male   DOB: 07-Oct-1985, 26 y.o.   MRN: [id number] D. The patient stayed in bed all evening, resting with his eyes closed. Avoids interaction with others. A. Encouraged to come to evening group. HS medications administered. R. Did not attend evening group. Non-compliant with medications. Threw medication on floor.

## 2012-08-19 DIAGNOSIS — F602 Antisocial personality disorder: Secondary | ICD-10-CM

## 2012-08-19 MED ORDER — QUETIAPINE FUMARATE ER 400 MG PO TB24: 400.0000 mg | ORAL_TABLET | Freq: Every day | ORAL | Status: DC

## 2012-08-19 MED ORDER — DIVALPROEX SODIUM 500 MG PO DR TAB: 1000.0000 mg | DELAYED_RELEASE_TABLET | Freq: Every day | ORAL | Status: DC

## 2012-08-19 NOTE — [facility] Suicide Risk Assessment (Signed)
Suicide Risk Assessment  Discharge Assessment     Demographic Factors:  Male  Mental Status Per Nursing Assessment::   On Admission:  NA  Current Mental Status by Physician: patient denies suicidal ideations, intent or plan  Loss Factors: Financial problems/change in socioeconomic status  Historical Factors: Impulsivity  Risk Reduction Factors:   Positive social support and Positive therapeutic relationship  Continued Clinical Symptoms:  Patient reports resolving mood symptoms.  Cognitive Features That Contribute To Risk:  Closed-mindedness Polarized thinking    Suicide Risk:  Minimal: No identifiable suicidal ideation.  Patients presenting with no risk factors but with morbid ruminations; may be classified as minimal risk based on the severity of the depressive symptoms  Discharge Diagnoses:   AXIS I:  Mood Disorder NOS AXIS II:  Deferred AXIS III:   Past Medical History  Diagnosis Date   AXIS IV:  housing problems and other psychosocial or environmental problems AXIS V:  61-70 mild symptoms  Plan Of Care/Follow-up recommendations:  Activity:  as tolerated Diet:  healthy Tests:  Valproic Other:  Patient to keep his after care appointment  Is patient on multiple antipsychotic therapies at discharge:  No   Has Patient had three or more failed trials of antipsychotic monotherapy by history:  No  Recommended Plan for Multiple Antipsychotic Therapies: N/A  Thedore Mins, MD 08/19/2012, 11:31 AM

## 2012-08-19 NOTE — Discharge Summary (Signed)
Physician Discharge Summary Note  Patient:  Isaac Cabrera is an 26 y.o., male MRN:  [id number] DOB:  31-May-1986 Patient phone:  [phone] (home)  Patient address:   [address] [place] [address],   Date of Admission:  08/11/2012 Date of Discharge: 08/19/2012   Reason for Admission: Patient is a 26 year old African American male with long history of mental illness dating back to 2007. Patient reports that he recently became homeless after he had an argument with his parents. He refused to elaborate on the discord between him and his parents but reports that they obtained a restraining order against him. Prior to this admission, he was found sleeping on his parents pouch, they call the police who brought him to the emergency room with EMS. Patient presents with poor insight, judgment, and delusional thinking. He reports that he stopped taking his medications because "they are poisoning me", he reports that "Jesus has helped him". Patient is reported by his parents to be aggressive and has threatened to burn down their house. He has been observed to be laughing inappropriately to himself, neglecting his personal hygiene and has not been sleeping for days.   Discharge Diagnoses:  Axis Diagnosis:   AXIS I:  Schizoaffective Disorder AXIS II:  Antisocial Personality Disorder Traits AXIS III:   Past Medical History  Diagnosis Date  . Schizophrenia   . Psychosis   . Bipolar affective disorder 2008   AXIS IV:  housing problems, problems related to social environment and problems with primary support group AXIS V:  61-70 mild symptoms  Level of Care:  OP  Hospital Course:  Pt admitted due to him being noncompliant with a restraining order. Medications altered to attempt mood stabilization and sleep. Pt remained manipulative, uninvolved in inpatient therapy.  Housing found at [facility]. Consults:  None  Significant Diagnostic Studies:  None  Discharge Vitals:   Blood  pressure 128/74, pulse 92, temperature 98.3 F (36.8 C), temperature source Oral, resp. rate 18, height 6' 2.5" (1.892 m), weight 73.936 kg (163 lb). Lab Results:   No results found for this or any previous visit (from the past 72 hour(s)).  Physical Findings: AIMS: Facial and Oral Movements Muscles of Facial Expression: None, normal Lips and Perioral Area: None, normal Jaw: None, normal Tongue: None, normal, Extremity Movements Upper (arms, wrists, hands, fingers): None, normal Lower (legs, knees, ankles, toes): None, normal,  Trunk Movements Neck, shoulders, hips: None, normal,  Overall Severity Severity of abnormal movements (highest score from questions above): None, normal Incapacitation due to abnormal movements: None, normal Patient's awareness of abnormal movements (rate only patient's report): No Awareness,  Dental Status Current problems with teeth and/or dentures?: No Does patient usually wear dentures?: No  CIWA:  CIWA-Ar Total: 0  COWS:  COWS Total Score: 0   Mental Status Exam: See Mental Status Examination and Suicide Risk Assessment completed by Attending Physician prior to discharge.  Discharge destination:  Other:  [facility]  Is patient on multiple antipsychotic therapies at discharge:  No   Has Patient had three or more failed trials of antipsychotic monotherapy by history:  No  Recommended Plan for Multiple Antipsychotic Therapies: none  Discharge Orders    Future Orders Please Complete By Expires   Diet - low sodium heart healthy      Diet - low sodium heart healthy      Increase activity slowly      Discharge instructions      Comments:   Take  all of your medications as prescribed.  Be sure to keep ALL follow up appointments as scheduled. This is to ensure getting your refills on time to avoid any interruption in your medication.  If you find that you can not keep your appointment, call the clinic and reschedule. Be sure to tell the nurse if you  will need a refill before your appointment.   Increase activity slowly      Discharge instructions      Comments:   Take all of your medications as prescribed.  Be sure to keep ALL follow up appointments as scheduled. This is to ensure getting your refills on time to avoid any interruption in your medication.  If you find that you can not keep your appointment, call the clinic and reschedule. Be sure to tell the nurse if you will need a refill before your appointment.       Medication List     As of 08/19/2012 11:24 AM    TAKE these medications      Indication    divalproex 500 MG DR tablet   Commonly known as: DEPAKOTE   Take 2 tablets (1,000 mg total) by mouth at bedtime.    Indication: Manic Phase of Manic-Depression      QUEtiapine 400 MG 24 hr tablet   Commonly known as: SEROQUEL XR   Take 1 tablet (400 mg total) by mouth daily.    Indication: Manic-Depression           Follow-up Information    Follow up with [facility]. On 08/23/2012. (walk-in intake appt scheduled at 8:00a.m with Dr. Eulah Pont)    Contact information:   [address] [place], [place] [address] [phone] (fax) [phone] (phone)         Follow-up recommendations: Take all of your medications as prescribed.  Be sure to keep ALL follow up appointments as scheduled. This is to ensure getting your refills on time to avoid any interruption in your medication.  If you find that you can not keep your appointment, call the clinic and reschedule. Be sure to tell the nurse if you will need a refill before your appointment.  Comments:  none  Signed: Norval Gable FNP-BC 08/19/2012, 11:24 AM

## 2012-08-19 NOTE — Progress Notes (Signed)
[facility] Case Management Discharge Plan:  Will you be returning to the same living situation after discharge: No. The patient is going to the [employer]. At discharge, do you have transportation home?:Yes,  CSW provided pt with a bus pass. Do you have the ability to pay for your medications:Yes,  Patient will follow up with [facility] whom has IPRS funds.  Interagency Information:     Release of information consent forms completed and in the chart;  Patient's signature needed at discharge.  Patient to Follow up at:  Follow-up Information    Follow up with [facility]. On 08/23/2012. (walk-in intake appt scheduled at 8:00a.m with Dr. Eulah Pont)    Contact information:   [address] [place], [place] [address] [phone] (fax) [phone] (phone)         Patient denies SI/HI:   Yes,      Safety Planning and Suicide Prevention discussed:  Yes,    Barrier to discharge identified: None to report  Summary and Recommendations:   Isaac Cabrera 08/19/2012, 3:15 PM

## 2012-08-19 NOTE — Progress Notes (Signed)
Patient ID: Isaac Cabrera, male   DOB: 14-Feb-1986, 26 y.o.   MRN: [id number] Nursing discharge note: patient discharged per MD order.  Patient received discharge instructions with medication samples and prescriptions.  He received bus pass and instructions to go to [facility] where he has a bed awaiting him.  Patient will follow up at [facility] for his medication management.  He denies any SI/HI/AVH.  He received all belongings out of his locker.  Patient left ambulatory for the bus station.

## 2012-08-20 NOTE — Discharge Summary (Signed)
Seen and agreed. Lucile Didonato, MD 

## 2012-08-23 NOTE — Progress Notes (Signed)
Patient Discharge Instructions:  After Visit Summary (AVS):   Faxed to:  08/23/12 Psychiatric Admission Assessment Note:   Faxed to:  08/23/12 Suicide Risk Assessment - Discharge Assessment:   Faxed to:  08/23/12 Faxed/Sent to the Next Level Care provider:  08/23/12 Faxed to [facility] @ [phone]  Jerelene Redden, 08/23/2012, 2:29 PM

## 2012-08-28 ENCOUNTER — Emergency Department (HOSPITAL_COMMUNITY)
Admission: EM | Admit: 2012-08-28 | Discharge: 2012-09-03 | Disposition: A | Discharge status: Home / Self Care | Payer: Self-pay | Attending: Emergency Medicine | Admitting: Emergency Medicine

## 2012-08-28 ENCOUNTER — Encounter (HOSPITAL_COMMUNITY): Payer: Self-pay | Admitting: *Deleted

## 2012-08-28 DIAGNOSIS — F259 Schizoaffective disorder, unspecified: Secondary | ICD-10-CM | POA: Insufficient documentation

## 2012-08-28 DIAGNOSIS — F29 Unspecified psychosis not due to a substance or known physiological condition: Secondary | ICD-10-CM | POA: Insufficient documentation

## 2012-08-28 DIAGNOSIS — Z79899 Other long term (current) drug therapy: Secondary | ICD-10-CM | POA: Insufficient documentation

## 2012-08-28 DIAGNOSIS — F17200 Nicotine dependence, unspecified, uncomplicated: Secondary | ICD-10-CM | POA: Insufficient documentation

## 2012-08-28 DIAGNOSIS — F319 Bipolar disorder, unspecified: Secondary | ICD-10-CM | POA: Insufficient documentation

## 2012-08-28 LAB — CBC WITH DIFFERENTIAL/PLATELET
Basophils Absolute: 0 10*3/uL (ref 0.0–0.1)
Basophils Relative: 1 % (ref 0–1)
Eosinophils Absolute: 0.2 10*3/uL (ref 0.0–0.7)
MCH: 30.9 pg (ref 26.0–34.0)
MCHC: 35.3 g/dL (ref 30.0–36.0)
Monocytes Relative: 9 % (ref 3–12)
Neutro Abs: 2.8 10*3/uL (ref 1.7–7.7)
Neutrophils Relative %: 54 % (ref 43–77)
RDW: 12.2 % (ref 11.5–15.5)

## 2012-08-28 LAB — URINALYSIS, ROUTINE W REFLEX MICROSCOPIC
Glucose, UA: NEGATIVE mg/dL
Leukocytes, UA: NEGATIVE
Nitrite: NEGATIVE
Specific Gravity, Urine: 1.037 — ABNORMAL HIGH (ref 1.005–1.030)
pH: 6 (ref 5.0–8.0)

## 2012-08-28 LAB — BASIC METABOLIC PANEL
BUN: 16 mg/dL (ref 6–23)
Chloride: 98 mEq/L (ref 96–112)
Creatinine, Ser: 1.09 mg/dL (ref 0.50–1.35)
GFR calc Af Amer: >90 mL/min (ref >90)
GFR calc non Af Amer: >90 mL/min (ref >90)
Potassium: 3.6 mEq/L (ref 3.5–5.1)

## 2012-08-28 LAB — RAPID URINE DRUG SCREEN, HOSP PERFORMED
Barbiturates: NOT DETECTED
Benzodiazepines: NOT DETECTED
Cocaine: NOT DETECTED

## 2012-08-28 LAB — ETHANOL: Alcohol, Ethyl (B): <11 mg/dL (ref 0–11)

## 2012-08-28 MED ORDER — ACETAMINOPHEN 325 MG PO TABS: 650.0000 mg | ORAL_TABLET | ORAL | Status: DC | PRN

## 2012-08-28 MED ORDER — DIVALPROEX SODIUM 500 MG PO DR TAB
1000.0000 mg | DELAYED_RELEASE_TABLET | Freq: Every day | ORAL | Status: DC
  Administered 2012-08-29 – 2012-08-30 (×2): 1000 mg via ORAL
  Filled 2012-08-28 (×2): qty 2

## 2012-08-28 MED ORDER — NICOTINE 21 MG/24HR TD PT24
21.0000 mg | MEDICATED_PATCH | Freq: Every day | TRANSDERMAL | Status: DC
  Administered 2012-08-29: 21 mg via TRANSDERMAL
  Filled 2012-08-28 (×6): qty 1

## 2012-08-28 MED ORDER — ZIPRASIDONE MESYLATE 20 MG IM SOLR
20.0000 mg | Freq: Once | INTRAMUSCULAR | Status: AC
  Administered 2012-08-28: 20 mg via INTRAMUSCULAR
  Filled 2012-08-28: qty 20

## 2012-08-28 MED ORDER — ZIPRASIDONE MESYLATE 20 MG IM SOLR
INTRAMUSCULAR | Status: AC
  Filled 2012-08-28: qty 20

## 2012-08-28 MED ORDER — ZIPRASIDONE MESYLATE 20 MG IM SOLR
10.0000 mg | Freq: Once | INTRAMUSCULAR | Status: AC
  Administered 2012-08-28: 10 mg via INTRAMUSCULAR

## 2012-08-28 MED ORDER — LORAZEPAM 1 MG PO TABS
1.0000 mg | ORAL_TABLET | Freq: Three times a day (TID) | ORAL | Status: DC | PRN
  Administered 2012-08-29 – 2012-08-31 (×3): 1 mg via ORAL
  Filled 2012-08-28 (×3): qty 1

## 2012-08-28 MED ORDER — QUETIAPINE FUMARATE ER 400 MG PO TB24
400.0000 mg | ORAL_TABLET | Freq: Every day | ORAL | Status: DC
  Administered 2012-08-29 – 2012-09-03 (×6): 400 mg via ORAL
  Filled 2012-08-28 (×9): qty 1

## 2012-08-28 MED ORDER — ONDANSETRON HCL 4 MG PO TABS: 4.0000 mg | ORAL_TABLET | Freq: Three times a day (TID) | ORAL | Status: DC | PRN

## 2012-08-28 MED ORDER — ALUM & MAG HYDROXIDE-SIMETH 200-200-20 MG/5ML PO SUSP: 30.0000 mL | ORAL | Status: DC | PRN

## 2012-08-28 MED ORDER — ZIPRASIDONE MESYLATE 20 MG IM SOLR
INTRAMUSCULAR | Status: AC
  Administered 2012-08-28: 20 mg via INTRAMUSCULAR
  Filled 2012-08-28: qty 20

## 2012-08-28 NOTE — ED Notes (Addendum)
When patient approached with a sandwich he spit a pill into the trash can. Pill retrieved from garbage, unable to identify. Pill is pink, oblong shaped, un-scored, imprint illegible (may say 007). Santina Evans, [place] notified.

## 2012-08-28 NOTE — ED Notes (Signed)
Unable to assess pt due to his outburst of laughing and talking in repeated manner

## 2012-08-28 NOTE — ED Notes (Signed)
Writer gave pt new pair of blue scrubs and red socks.

## 2012-08-28 NOTE — ED Notes (Signed)
Pt is walking around the nurses station cursing, yelling, and threatening staff. Writer called security.

## 2012-08-28 NOTE — [facility] Counselor (Signed)
Telepsych consult request faxed, followed up with SOC 1 hr after fax and was told that the Dr was backed up and would get to the pt as soon as possible.

## 2012-08-28 NOTE — BH Assessment (Signed)
Assessment Note   Isaac Cabrera is a 26 y.o. male who presents to [facility] after being found by [facility] and [employer].  Pt denies SI/HI, however pt has disorganized thinking, repetitive speech--calling this writer "chip off the old block" and "I'm going to get my money".  Pt unable to explain to this writer to what he is referring.  Pt was in observed unclothed in the bathroom with water all over the floor and screaming.  Once back in room, pt was in bed screaming.  Pt repeats several times that he hungry and this writer advised lunch would be served soon.  Pt then told this writer that there was a resemblance to his mother.  Pt recently inpt with [facility] and d/c on 08/19/12.  Pt told medical staff that he was out of his lithium and needed a refill.  Pt is labile and inappropriately touching male staff. Pt denies AVH w/command.  Pt is pending telepsych for final disposition.  Axis I: Psychotic Disorder NOS Axis II: Deferred Axis III:  Past Medical History  Diagnosis Date  . Schizophrenia   . Psychosis   . Bipolar affective disorder 2008   Axis IV: economic problems, housing problems, other psychosocial or environmental problems, problems related to social environment and problems with primary support group Axis V: 31-40 impairment in reality testing  Past Medical History:  Past Medical History  Diagnosis Date  . Schizophrenia   . Psychosis   . Bipolar affective disorder 2008    History reviewed. No pertinent past surgical history.  Family History: No family history on file.  Social History:  reports that he has been smoking Cigarettes.  He has a 1.25 pack-year smoking history. He does not have any smokeless tobacco history on file. He reports that he drinks about 1.2 ounces of alcohol per week. He reports that he does not use illicit drugs.  Additional Social History:  Alcohol / Drug Use Pain Medications: None  Prescriptions: None  Over the Counter: None  History of alcohol / drug use?:  Yes Longest period of sobriety (when/how long): None  Substance #1 Name of Substance 1: Alcohol--Beer  1 - Age of First Use: 18 YOM  1 - Amount (size/oz): Varies  1 - Frequency: Bi-weekly  1 - Duration: On-going  1 - Last Use / Amount: 2 Wks Ago   CIWA: CIWA-Ar BP: 147/78 mmHg Pulse Rate: 78  Nausea and Vomiting: no nausea and no vomiting Tactile Disturbances: none Tremor: no tremor Auditory Disturbances: not present Paroxysmal Sweats: no sweat visible Visual Disturbances: not present Anxiety: no anxiety, at ease Headache, Fullness in Head: none present Agitation: normal activity Orientation and Clouding of Sensorium: oriented and can do serial additions CIWA-Ar Total: 0  COWS:    Allergies: No Known Allergies  Home Medications:  (Not in a hospital admission)  OB/GYN Status:  No LMP for male patient.  General Assessment Data Location of Assessment: [facility] Living Arrangements: Alone Can pt return to current living arrangement?: Yes Admission Status: Voluntary Is patient capable of signing voluntary admission?: Yes Transfer from: Acute Hospital Referral Source: MD  Education Status Is patient currently in school?: No Current Grade: None  Highest grade of school patient has completed: Graduated high school Name of school: None  Contact person: None   Risk to self Suicidal Ideation: No Suicidal Intent: No Is patient at risk for suicide?: No Suicidal Plan?: No Access to Means: No What has been your use of drugs/alcohol within the last 12 months?: THC,  alcohol  Previous Attempts/Gestures: No How many times?: 0  Other Self Harm Risks: None  Triggers for Past Attempts: None known Intentional Self Injurious Behavior: None Family Suicide History: No Recent stressful life event(s): Other (Comment) (States out of meds, Homeless(?)) Persecutory voices/beliefs?: No Depression: Yes Depression Symptoms: Feeling angry/irritable Substance abuse history and/or treatment  for substance abuse?: Yes Suicide prevention information given to non-admitted patients: Not applicable  Risk to Others Homicidal Ideation: No Thoughts of Harm to Others: No Current Homicidal Intent: No Current Homicidal Plan: No Access to Homicidal Means: No Identified Victim: None  History of harm to others?: No Assessment of Violence: None Noted Violent Behavior Description: None  Does patient have access to weapons?: No Criminal Charges Pending?: Yes Describe Pending Criminal Charges: Rsisting Arrest, Assualt, Intoxication/Disruptive; unk if charges are still pending  Does patient have a court date: No (Unk )  Psychosis Hallucinations: None noted (Pt has hx of AVH ) Delusions: None noted  Mental Status Report Appear/Hygiene: Other (Comment) (Appropriate ) Eye Contact: Good Motor Activity: Agitation;Unremarkable Speech: Soft;Tangential Level of Consciousness: Alert Mood: Preoccupied;Labile Affect: Preoccupied;Labile Anxiety Level: None Thought Processes: Tangential;Flight of Ideas Judgement: Impaired Orientation: Person;Place;Time;Situation Obsessive Compulsive Thoughts/Behaviors: None  Cognitive Functioning Concentration: Normal Memory: Recent Intact;Remote Intact IQ: Average Insight: Poor Impulse Control: Poor Appetite: Good Weight Loss: 0  Weight Gain: 0  Sleep: Decreased (Unk ) Total Hours of Sleep:  (Unk ) Vegetative Symptoms: None  ADLScreening [facility] Assessment Services) Patient's cognitive ability adequate to safely complete daily activities?: Yes Patient able to express need for assistance with ADLs?: Yes Independently performs ADLs?: Yes (appropriate for developmental age)  Abuse/Neglect [facility]) Physical Abuse: Denies Verbal Abuse: Denies Sexual Abuse: Denies  Prior Inpatient Therapy Prior Inpatient Therapy: Yes Prior Therapy Dates: 2007-2013 Prior Therapy Facilty/Provider(s): [facility], [facility], [facility], [facility], and out of state facilites Reason for  Treatment: [place], [facility], and [facility]  Prior Outpatient Therapy Prior Outpatient Therapy: No Prior Therapy Dates: None  Prior Therapy Facilty/Provider(s): None  Reason for Treatment: None   ADL Screening (condition at time of admission) Patient's cognitive ability adequate to safely complete daily activities?: Yes Patient able to express need for assistance with ADLs?: Yes Independently performs ADLs?: Yes (appropriate for developmental age) Weakness of Legs: None Weakness of Arms/Hands: None  Home Assistive Devices/Equipment Home Assistive Devices/Equipment: None  Therapy Consults (therapy consults require a physician order) PT Evaluation Needed: No OT Evalulation Needed: No SLP Evaluation Needed: No Abuse/Neglect Assessment (Assessment to be complete while patient is alone) Physical Abuse: Denies Verbal Abuse: Denies Sexual Abuse: Denies Exploitation of patient/patient's resources: Denies Self-Neglect: Denies Values / Beliefs Cultural Requests During Hospitalization: None Spiritual Requests During Hospitalization: None Consults Spiritual Care Consult Needed: No Social Work Consult Needed: No Merchant navy officer (For Healthcare) Advance Directive: Patient does not have advance directive;Patient would not like information Pre-existing out of facility DNR order (yellow form or pink MOST form): No Nutrition Screen- [facility] Patient's home diet: Regular Have you recently lost weight without trying?: No Have you been eating poorly because of a decreased appetite?: No Malnutrition Screening Tool Score: 0   Additional Information 1:1 In Past 12 Months?: No CIRT Risk: No Elopement Risk: No Does patient have medical clearance?: Yes     Disposition:  Disposition Disposition of Patient: Referred to (Telepsych ) Patient referred to: Other (Comment) (Telepsych )  On Site Evaluation by:   Reviewed with Physician:     Murrell Redden 08/28/2012 11:32 AM

## 2012-08-28 NOTE — ED Notes (Signed)
Pt walked out of the bathroom naked and refused to put on his scrubs. Pt walked aback into the room without any clothes on

## 2012-08-28 NOTE — ED Notes (Signed)
Pt began walking in hallway and touching male staff member and other pt's hand.  Pt told to stop by security.  Pt went back to room and took off shirt and making snowangels on the floor

## 2012-08-28 NOTE — ED Notes (Signed)
Per PT[facility] pt found outside; [employer] at scene; pt out of lithium and wanting meds; pt laughing inappropriately on triage

## 2012-08-28 NOTE — ED Notes (Signed)
Pt is asleep, and just got medication to calm down. Writer will wait until pt awakes back up.

## 2012-08-28 NOTE — ED Provider Notes (Signed)
History     CSN: [id number]  Arrival date & time 08/28/12  0127   First MD Initiated Contact with Patient 08/28/12 0131      Chief Complaint  Patient presents with  . Medical Clearance    (Consider location/radiation/quality/duration/timing/severity/associated sxs/prior treatment) HPI History provided by patient and prior chart.  Level 5 caveat applies d/t psychosis.  Per prior chart, pt has a h/o schizoaffective disorder and antisocial personality disorder traits, most recent admission to [facility]10[facility]11[facility]18.  Discharged home on Seroquel and Depakote.  PTAR found him in a parking lot this evening and brought him in.  He told them that he was out of his lithium and needed a refill.  Pt denies SI/HI.   Past Medical History  Diagnosis Date  . Schizophrenia   . Psychosis   . Bipolar affective disorder 2008    History reviewed. No pertinent past surgical history.  No family history on file.  History  Substance Use Topics  . Smoking status: Current Some Day Smoker -- 0.2 packs/day for 5 years    Types: Cigarettes  . Smokeless tobacco: Not on file  . Alcohol Use: 1.2 oz/week    2 Cans of beer per week     Comment: 2 Xs/week      Review of Systems  All other systems reviewed and are negative.    Allergies  Review of patient's allergies indicates no known allergies.  Home Medications   Current Outpatient Rx  Name  Route  Sig  Dispense  Refill  . DIVALPROEX SODIUM 500 MG PO TBEC   Oral   Take 2 tablets (1,000 mg total) by mouth at bedtime.   60 tablet   0   . QUETIAPINE FUMARATE ER 400 MG PO TB24   Oral   Take 1 tablet (400 mg total) by mouth daily.   30 tablet   0     BP 147/78  Pulse 79  Temp 99 F (37.2 C) (Oral)  Resp 18  SpO2 97%  Physical Exam  Nursing note and vitals reviewed. Constitutional: He is oriented to person, place, and time. He appears well-developed and well-nourished. No distress.  HENT:  Head: Normocephalic and  atraumatic.  Eyes:       Normal appearance  Neck: Normal range of motion.  Pulmonary/Chest: Effort normal.  Musculoskeletal: Normal range of motion.  Neurological: He is alert and oriented to person, place, and time.  Psychiatric:       Flat affect, stares, laughs inappropriately, argumentative, cursing, talks about people walking around the room.     ED Course  Procedures (including critical care time)  Labs Reviewed - No data to display No results found.   1. Psychosis       MDM  26yo M w/ schizophrenia found outside by [facility] and brought to ED.  Pt psychotic and agitated in Triage but improved w/ IM Geodon.  Labs unremarkable.  Moved to psych ED for telepsych consult.  Holding orders written.          Arie Sabina Kaarin Pardy, PA-C 09/02/12 1246

## 2012-08-28 NOTE — ED Notes (Signed)
Pt laying in bed with privates out of pants.  Pt told by security to cover self.  Pt complied.

## 2012-08-28 NOTE — ED Provider Notes (Signed)
Pt seen in psych ED this morning.  He is resting comfortably.  He was given geodon due to pychosis last night.  ACT team has not yet been able to assess him.  Will likely need placement based on report from overnight phsycisian.    Ethelda Chick, MD 08/28/12 [phone]

## 2012-08-28 NOTE — ED Notes (Signed)
After Nurse gave pt medication, pt st he wanted to take a shower, RN advised him not to take a shower due to the fact he was acting aggressive towards to the staff.  Pt still went on to the bathroom and stripped down naked.  Then pt proceeded to walk out of the bathroom naked, security and staff told him he needs to put his clothes back on and come on out of the bathroom. Pt refused and headed back into the shower.

## 2012-08-29 MED ORDER — ZIPRASIDONE MESYLATE 20 MG IM SOLR
10.0000 mg | Freq: Four times a day (QID) | INTRAMUSCULAR | Status: DC | PRN
  Administered 2012-08-29 – 2012-09-01 (×3): 10 mg via INTRAMUSCULAR
  Filled 2012-08-29 (×3): qty 20

## 2012-08-29 MED ORDER — OLANZAPINE 5 MG PO TABS
10.0000 mg | ORAL_TABLET | Freq: Every day | ORAL | Status: DC
Start: 2012-08-29 — End: 2012-09-03
  Administered 2012-08-29 – 2012-09-02 (×5): 10 mg via ORAL
  Filled 2012-08-29 (×4): qty 2
  Filled 2012-08-29 (×2): qty 1

## 2012-08-29 NOTE — ED Notes (Signed)
Pt dancing in room, singing infront of television. Pt ambulated to bathroom multiple times within hour.

## 2012-08-29 NOTE — ED Notes (Signed)
Pt ambulated to bathroom 

## 2012-08-29 NOTE — ED Notes (Signed)
Pt came up to nursing station. Bizarre affect. Asked for caffeinated product, told that we don't carry caffeine products. Pt asked for coffee and was told coffee isn't given at this time of night. Pt asked for a sandwich and request granted.

## 2012-08-29 NOTE — ED Notes (Signed)
Pt was yelling and chanting in front of the window, writer told pt he needs to go back into his room.  Pt just stared and started chanting unknowns words again.

## 2012-08-29 NOTE — [facility] Counselor (Addendum)
Declined at [facility] by Verne Spurr, PA, due to acuity.   Per Larita Fife, pt has been declined at [facility] due to Acuity.  Referred to [facility]   Information faxed to [facility] and Verbal referral completed. [facility] authorization number is [id number]. Per [place], pt is on [facility] wait list.

## 2012-08-29 NOTE — ED Notes (Signed)
Pt is jumping around and playing basketball in his room

## 2012-08-29 NOTE — ED Notes (Signed)
Pt laying on his bed watching TV.

## 2012-08-29 NOTE — ED Notes (Signed)
Pt up to bathroom.

## 2012-08-29 NOTE — ED Provider Notes (Signed)
8:38 AM Patient sitting upright, watching TV at a very high volume.  He states that he is hungry and thirsty.  Per RN, the patient continues to have cyclical outbursts, and has required multiple doses of Geodon.  A PRN order was added for this.  A review of the patient's telepsych report demonstrates that the patient should be provided 10mg  Zyprexa nightly.  This was ordered.  Placement is pending.  Gerhard Munch, MD 08/29/12 [phone]

## 2012-08-29 NOTE — ED Notes (Signed)
Pt requested peanut butter and graham crackers with diet ginger ale no ice, request granted.

## 2012-08-29 NOTE — ED Notes (Signed)
Pt up to the nursing station but denied any needs. Pt just given diet ginger ale per his request.

## 2012-08-30 NOTE — ED Notes (Signed)
Pt up changing the TV station

## 2012-08-30 NOTE — ED Provider Notes (Signed)
Pt in no distress this am.  Still awaiting psychiatric placement.   VSS  Celene Kras, MD 08/30/12 [phone]

## 2012-08-30 NOTE — ED Notes (Addendum)
Pt just up to nursing station asking for a sandwich and a drink. Pt respectful saying yes ma'am and no ma'am.

## 2012-08-30 NOTE — ED Notes (Signed)
Pt came up to nursing station, looked at magazines briefly then went back to his room.

## 2012-08-30 NOTE — Progress Notes (Signed)
[facility] ED CM reviewed ED clinicals for pt Noting UA cloudy with specific gravity 1.037 and a case of ketones only D/C plans: awaiting psychiatric placement

## 2012-08-31 MED ORDER — DIVALPROEX SODIUM 500 MG PO DR TAB
500.0000 mg | DELAYED_RELEASE_TABLET | Freq: Two times a day (BID) | ORAL | Status: DC
  Administered 2012-08-31 – 2012-09-02 (×5): 500 mg via ORAL
  Administered 2012-09-02: 10:00:00 via ORAL
  Administered 2012-09-03: 500 mg via ORAL
  Filled 2012-08-31 (×8): qty 1

## 2012-08-31 NOTE — ED Notes (Signed)
Pt denies having voices, denies SI/HI. He's been recommended for inpatient treatment via Telepsych and is on the wait list at [facility] thus he needs IVC papers, ACT team aware of IVC paper need.

## 2012-08-31 NOTE — ED Notes (Signed)
Tech in getting VS

## 2012-08-31 NOTE — ED Provider Notes (Signed)
7:33 AM Filed Vitals:   08/31/12 0606  BP: 118/79  Pulse: 74  Temp: 97.5 F (36.4 C)  Resp: 16   awaitng placement. No complaints  Lyanne Co, MD 08/31/12 [phone]

## 2012-08-31 NOTE — ED Notes (Signed)
Pt sitting up in the bed watching TV.

## 2012-08-31 NOTE — ED Notes (Signed)
Pt outside of his room door, just given peanut butter and crackers.

## 2012-09-01 NOTE — ED Notes (Signed)
Pt up to nursing station asking for peanut butter and graham crackers and ginger ale. Request granted.

## 2012-09-01 NOTE — ED Notes (Signed)
Con. & alert requesting graham cracker & peanut butter.

## 2012-09-01 NOTE — ED Notes (Signed)
When informed of getting shot. Patient jumped out of bed walked down the hall stating does not want that @#* in his hip want it in his arm explained can not give this medication in arm. Continues to use profanity. Officers are now present & re-directed to room.

## 2012-09-01 NOTE — ED Notes (Signed)
Noted patient babbling unable to make sense of conversation.

## 2012-09-01 NOTE — ED Provider Notes (Signed)
Patient sleeping 10:09 AM Vital signs reviewed and normal. Labs from admission reviewed and normal as below.  Patient reportedly has a bed at [facility] today.   Results for orders placed during the hospital encounter of 08/28/12  CBC WITH DIFFERENTIAL      Component Value Range   WBC 5.1  4.0 - 10.5 K/uL   RBC 4.76  4.22 - 5.81 MIL/uL   Hemoglobin 14.7  13.0 - 17.0 g/dL   HCT 16.1  09.6 - 04.5 %   MCV 87.4  78.0 - 100.0 fL   MCH 30.9  26.0 - 34.0 pg   MCHC 35.3  30.0 - 36.0 g/dL   RDW 40.9  81.1 - 91.4 %   Platelets 202  150 - 400 K/uL   Neutrophils Relative 54  43 - 77 %   Neutro Abs 2.8  1.7 - 7.7 K/uL   Lymphocytes Relative 33  12 - 46 %   Lymphs Abs 1.7  0.7 - 4.0 K/uL   Monocytes Relative 9  3 - 12 %   Monocytes Absolute 0.5  0.1 - 1.0 K/uL   Eosinophils Relative 5  0 - 5 %   Eosinophils Absolute 0.2  0.0 - 0.7 K/uL   Basophils Relative 1  0 - 1 %   Basophils Absolute 0.0  0.0 - 0.1 K/uL  BASIC METABOLIC PANEL      Component Value Range   Sodium 135  135 - 145 mEq/L   Potassium 3.6  3.5 - 5.1 mEq/L   Chloride 98  96 - 112 mEq/L   CO2 29  19 - 32 mEq/L   Glucose, Bld 94  70 - 99 mg/dL   BUN 16  6 - 23 mg/dL   Creatinine, Ser 7.82  0.50 - 1.35 mg/dL   Calcium 9.4  8.4 - 95.6 mg/dL   GFR calc non Af Amer >90  >90 mL/min   GFR calc Af Amer >90  >90 mL/min  ETHANOL      Component Value Range   Alcohol, Ethyl (B) <11  0 - 11 mg/dL  URINE RAPID DRUG SCREEN (HOSP PERFORMED)      Component Value Range   Opiates NONE DETECTED  NONE DETECTED   Cocaine NONE DETECTED  NONE DETECTED   Benzodiazepines NONE DETECTED  NONE DETECTED   Amphetamines NONE DETECTED  NONE DETECTED   Tetrahydrocannabinol NONE DETECTED  NONE DETECTED   Barbiturates NONE DETECTED  NONE DETECTED  URINALYSIS, ROUTINE W REFLEX MICROSCOPIC      Component Value Range   Color, Urine YELLOW  YELLOW   APPearance CLOUDY (*) CLEAR   Specific Gravity, Urine 1.037 (*) 1.005 - 1.030   pH 6.0  5.0 - 8.0   Glucose, UA  NEGATIVE  NEGATIVE mg/dL   Hgb urine dipstick NEGATIVE  NEGATIVE   Bilirubin Urine NEGATIVE  NEGATIVE   Ketones, ur TRACE (*) NEGATIVE mg/dL   Protein, ur NEGATIVE  NEGATIVE mg/dL   Urobilinogen, UA 1.0  0.0 - 1.0 mg/dL   Nitrite NEGATIVE  NEGATIVE   Leukocytes, UA NEGATIVE  NEGATIVE     Hilario Quarry, MD 09/01/12 1010

## 2012-09-01 NOTE — ED Notes (Signed)
Patient had to be redirected to go to room showing tattoos while standing in the hallway to patient room 42. Officer had to redirect patient.

## 2012-09-02 NOTE — ED Notes (Signed)
Patient currently asleep; no distress noted. Respirations regular and unlabored. Q 15 minute safety rounds continued.

## 2012-09-02 NOTE — ED Provider Notes (Signed)
Pt resting, nad. Vitals normal. Discussed w act team, placement pending.   Suzi Roots, MD 09/02/12 [phone]

## 2012-09-02 NOTE — BH Assessment (Signed)
Assessment Note   Isaac Cabrera is an 26 y.o. male that is being reassessed as he awaits inpatient psychiatric placement.  Pt has been accepted to the wait list at [facility] per Larita Fife on 12/01 and [facility] per Kristen on 12/02, once a [facility] bed opens up.  Pt remains fixed in his thinking that he does not pose a threat to himself or others and does not feel that he needs psychiatric medications to control his bizarre and inappropriate behaviors or his agitation.  Pt has had to have [employer] intervene when nursing staff were attempting to give pt a shot.  Pt was attempting to walk away. Pt does admit that he abuses alcohol and Cannibus while remaining non-compliant with his medications and his outpatient treatment.  Pt has minimal positive supports.      Axis I: Psychotic Disorder NOS Axis II: Deferred Axis III:  Past Medical History  Diagnosis Date  . Schizophrenia   . Psychosis   . Bipolar affective disorder 2008   Axis IV: housing problems, other psychosocial or environmental problems, problems related to legal system/crime, problems related to social environment and problems with primary support group Axis V: 21-30 behavior considerably influenced by delusions or hallucinations OR serious impairment in judgment, communication OR inability to function in almost all areas  Past Medical History:  Past Medical History  Diagnosis Date  . Schizophrenia   . Psychosis   . Bipolar affective disorder 2008    History reviewed. No pertinent past surgical history.  Family History: No family history on file.  Social History:  reports that he has been smoking Cigarettes.  He has a 1.25 pack-year smoking history. He does not have any smokeless tobacco history on file. He reports that he drinks about 1.2 ounces of alcohol per week. He reports that he does not use illicit drugs.  Additional Social History:  Alcohol / Drug Use Pain Medications: None  Prescriptions: None  Over the Counter: None  History of  alcohol / drug use?: Yes Longest period of sobriety (when/how long): None  Substance #1 Name of Substance 1: Alcohol--Beer  1 - Age of First Use: 18 YOM  1 - Amount (size/oz): Varies  1 - Frequency: Bi-weekly  1 - Duration: On-going  1 - Last Use / Amount: 2 Wks Ago   CIWA: CIWA-Ar BP: 115/73 mmHg Pulse Rate: 81  Nausea and Vomiting: no nausea and no vomiting Tactile Disturbances: none Tremor: no tremor Auditory Disturbances: not present Paroxysmal Sweats: no sweat visible Visual Disturbances: not present Anxiety: no anxiety, at ease Headache, Fullness in Head: none present Agitation: normal activity Orientation and Clouding of Sensorium: oriented and can do serial additions CIWA-Ar Total: 0  COWS:    Allergies: No Known Allergies  Home Medications:  (Not in a hospital admission)  OB/GYN Status:  No LMP for male patient.  General Assessment Data Location of Assessment: [facility] ED Living Arrangements: Alone;Other (Comment) (currently reports homelessness) Can pt return to current living arrangement?: Yes Admission Status: Other (Comment) (IVC papers needed because pt going to [facility]) Is patient capable of signing voluntary admission?: No Transfer from: Acute Hospital Referral Source: MD  Education Status Is patient currently in school?: No Current Grade: None  Highest grade of school patient has completed: 12th grade Name of school: None  Contact person: None   Risk to self Suicidal Ideation: No Suicidal Intent: No Is patient at risk for suicide?: No Suicidal Plan?: No Access to Means: No What has been your use of drugs/alcohol  within the last 12 months?: Cannibus and alcohol Previous Attempts/Gestures: No How many times?: 0  Other Self Harm Risks: unpredictable Triggers for Past Attempts: None known Intentional Self Injurious Behavior: None Family Suicide History: No Recent stressful life event(s):  (medication non-compliance and housing issues) Persecutory  voices/beliefs?: No Depression: Yes Depression Symptoms: Feeling worthless/self pity;Feeling angry/irritable Substance abuse history and/or treatment for substance abuse?: No Suicide prevention information given to non-admitted patients: Not applicable  Risk to Others Homicidal Ideation: No Thoughts of Harm to Others: No Current Homicidal Intent: No Current Homicidal Plan: No Access to Homicidal Means: No Identified Victim: none per report History of harm to others?: No Assessment of Violence: In past 6-12 months Violent Behavior Description: has current resisting areest and assault charges pending Does patient have access to weapons?: No Criminal Charges Pending?: Yes Describe Pending Criminal Charges: Resisting Arrest and Assault Does patient have a court date:  (unknown dates)  Psychosis Hallucinations:  (bizarre affect and behavior when assessed; unknown) Delusions: Unspecified  Mental Status Report Appear/Hygiene: Other (Comment) (appropriate) Eye Contact: Fair Motor Activity: Unremarkable Speech: Soft Level of Consciousness: Quiet/awake Mood: Ambivalent;Preoccupied Affect: Preoccupied;Labile Anxiety Level: Moderate Thought Processes: Irrelevant;Flight of Ideas Judgement: Impaired Orientation: Person;Place;Time Obsessive Compulsive Thoughts/Behaviors: Moderate  Cognitive Functioning Concentration: Decreased Memory: Recent Impaired;Remote Impaired IQ: Average Insight: Poor Impulse Control: Poor Appetite: Good Weight Loss: 0  Weight Gain: 0  Sleep:  (unknown; sporadic while here) Total Hours of Sleep:  (Unk ) Vegetative Symptoms: None  ADLScreening [facility] Assessment Services) Patient's cognitive ability adequate to safely complete daily activities?: Yes Patient able to express need for assistance with ADLs?: Yes Independently performs ADLs?: Yes (appropriate for developmental age)  Abuse/Neglect [facility]) Physical Abuse: Denies Verbal Abuse: Denies Sexual  Abuse: Denies  Prior Inpatient Therapy Prior Inpatient Therapy: Yes Prior Therapy Dates: 2007-2013 Prior Therapy Facilty/Provider(s): [facility], [facility], [facility], [facility], and out of state facilites Reason for Treatment: [facility], [facility], and [facility]  Prior Outpatient Therapy Prior Outpatient Therapy: Yes Prior Therapy Dates: currently Prior Therapy Facilty/Provider(s): [facility] Reason for Treatment: medication management, though non-compliant  ADL Screening (condition at time of admission) Patient's cognitive ability adequate to safely complete daily activities?: Yes Patient able to express need for assistance with ADLs?: Yes Independently performs ADLs?: Yes (appropriate for developmental age) Weakness of Legs: None Weakness of Arms/Hands: None  Home Assistive Devices/Equipment Home Assistive Devices/Equipment: None  Therapy Consults (therapy consults require a physician order) PT Evaluation Needed: No OT Evalulation Needed: No SLP Evaluation Needed: No Abuse/Neglect Assessment (Assessment to be complete while patient is alone) Physical Abuse: Denies Verbal Abuse: Denies Sexual Abuse: Denies Exploitation of patient/patient's resources: Denies Self-Neglect: Denies Values / Beliefs Cultural Requests During Hospitalization: None Spiritual Requests During Hospitalization: None Consults Spiritual Care Consult Needed: No Social Work Consult Needed: No Merchant navy officer (For Healthcare) Advance Directive: Patient does not have advance directive;Patient would not like information Pre-existing out of facility DNR order (yellow form or pink MOST form): No Nutrition Screen- [facility] Patient's home diet: Regular Have you recently lost weight without trying?: No Have you been eating poorly because of a decreased appetite?: No Malnutrition Screening Tool Score: 0   Additional Information 1:1 In Past 12 Months?: Yes CIRT Risk: No Elopement Risk: No Does patient have medical clearance?: Yes      Disposition: On wait list at [facility] and [facility]. Disposition Disposition of Patient: Referred to Patient referred to: Other (Comment);[facility] (on [facility] list at [facility] and [facility])  On Site Evaluation by:   Reviewed with  Physician:     Angelica Ran 09/02/2012 4:45 AM

## 2012-09-03 DIAGNOSIS — F259 Schizoaffective disorder, unspecified: Secondary | ICD-10-CM

## 2012-09-03 MED ORDER — OLANZAPINE 10 MG PO TABS: 10.0000 mg | ORAL_TABLET | Freq: Every day | ORAL | Status: DC

## 2012-09-03 NOTE — Consult Note (Signed)
Reason for Consult: Schizoaffective disorder Referring Physician: Dr. Ruthe Mannan is an 26 y.o. male.  HPI: Patient was seen and chart reviewed. Patient was brought in by [employer].  Patient presented with disorganized thinking, disorganized behaviors inappropriate on his arrival. Reportedly patient was not compliant with his medication for a few days Patient has been compliant with his medications and cooperative with staff. He was recently discharged from [facility]  patient stated that he came to the emergency department for stabilization while his extended family were out of home for "Thanksgiving". Patient denied symptoms of for depression, mania, psychosis, suicidal ideation, homicidal ideation, intentions and plans. Patient feels stable. That he can be able to function outside the hospital. Patient is willing to participate, not. Patient psychiatric services at [facility] as he was previously treated.    MSE: . Patient appeared  Calm quite uncooperative. Patient stated mood am fine and his affect was appropriate he has no abnormal psychomotor activities. Patient has no extrapyramidal symptoms. Patient denied current suicidal or homicidal ideation, intentions, or plans. Patient has no evidence of psychotic symptoms, delusions, paranoia, auditory or visual hallucinations.  Past Medical History  Diagnosis Date  . Schizophrenia   . Psychosis   . Bipolar affective disorder 2008    History reviewed. No pertinent past surgical history.  No family history on file.  Social History:  reports that he has been smoking Cigarettes.  He has a 1.25 pack-year smoking history. He does not have any smokeless tobacco history on file. He reports that he drinks about 1.2 ounces of alcohol per week. He reports that he does not use illicit drugs.  Allergies: No Known Allergies  Medications: I have reviewed the patient's current medications.  No results found for this or any previous visit (from the past 48  hour(s)).  No results found.  Positive for bipolar and non compliance Blood pressure 123/74, pulse 75, temperature 98.4 F (36.9 C), temperature source Oral, resp. rate 18, SpO2 97.00%.   Assessment/Plan: Schizoaffective disorder, most recent episode unspecified.  Patient does not meet criteria for acute psychiatric hospitalization and will be referred to the outpatient psychiatric services at [facility] behavioral health. His current medications are Depakote ER 500 mg 2 pills at bedtime, Seroquel 400 mg at bedtime, and Zyprexa 10 mg at bedtime.    Zissel Biederman,JANARDHAHA R. 09/03/2012, 4:26 PM

## 2012-09-03 NOTE — [facility] Suicide Risk Assessment (Signed)
Suicide Risk Assessment  Discharge Assessment     Demographic Factors:  Male, Adolescent or young adult, Low socioeconomic status and Unemployed  Mental Status Per Nursing Assessment::   On Admission:     Current Mental Status by Physician: NA  Loss Factors: Financial problems/change in socioeconomic status  Historical Factors: NA  Risk Reduction Factors:   Sense of responsibility to family, Religious beliefs about death, Living with another person, especially a relative, Positive social support and Positive coping skills or problem solving skills  Continued Clinical Symptoms:  Bipolar Disorder:   Mixed State Schizophrenia:   Paranoid or undifferentiated type Previous Psychiatric Diagnoses and Treatments  Cognitive Features That Contribute To Risk:  Polarized thinking    Suicide Risk:  Minimal: No identifiable suicidal ideation.  Patients presenting with no risk factors but with morbid ruminations; may be classified as minimal risk based on the severity of the depressive symptoms  Discharge Diagnoses:   AXIS I:  Schizoaffective Disorder AXIS II:  Deferred AXIS III:   Past Medical History  Diagnosis Date  . Schizophrenia   . Psychosis   . Bipolar affective disorder 2008   AXIS IV:  economic problems, housing problems, occupational problems and problems with primary support group AXIS V:  41-50 serious symptoms  Plan Of Care/Follow-up recommendations:  Activity:  as tolerated Diet:  regular  Is patient on multiple antipsychotic therapies at discharge:  No   Has Patient had three or more failed trials of antipsychotic monotherapy by history:  No  Recommended Plan for Multiple Antipsychotic Therapies: Not applicable  Ambrose Wile,JANARDHAHA R. 09/03/2012, 4:48 PM

## 2012-09-03 NOTE — ED Provider Notes (Signed)
Medical screening examination/treatment/procedure(s) were performed by non-physician practitioner and as supervising physician I was immediately available for consultation/collaboration.  Sunnie Nielsen, MD 09/03/12 [phone]

## 2012-09-03 NOTE — ED Provider Notes (Signed)
BP 116/70  Pulse 64  Temp 97.6 F (36.4 C) (Oral)  Resp 20  SpO2 100% Resting comfortably. No acute issues overnight. Awaiting [facility] bed  Loren Racer, MD 09/03/12 [phone]

## 2014-01-02 ENCOUNTER — Encounter (HOSPITAL_COMMUNITY): Payer: Self-pay | Admitting: Emergency Medicine

## 2014-01-02 ENCOUNTER — Emergency Department (INDEPENDENT_AMBULATORY_CARE_PROVIDER_SITE_OTHER)
Admission: EM | Admit: 2014-01-02 | Discharge: 2014-01-02 | Disposition: A | Discharge status: Home / Self Care | Payer: Self-pay | Source: Home / Self Care | Attending: Family Medicine | Admitting: Family Medicine

## 2014-01-02 DIAGNOSIS — J309 Allergic rhinitis, unspecified: Secondary | ICD-10-CM

## 2014-01-02 LAB — POCT RAPID STREP A: Streptococcus, Group A Screen (Direct): NEGATIVE

## 2014-01-02 MED ORDER — METHYLPREDNISOLONE 4 MG PO KIT: PACK | ORAL | Status: DC

## 2014-01-02 MED ORDER — OXYMETAZOLINE HCL 0.05 % NA SOLN: 1.0000 | Freq: Two times a day (BID) | NASAL | Status: DC

## 2014-01-02 MED ORDER — FEXOFENADINE-PSEUDOEPHED ER 60-120 MG PO TB12: 1.0000 | ORAL_TABLET | Freq: Two times a day (BID) | ORAL | Status: DC

## 2014-01-02 MED ORDER — FLUTICASONE PROPIONATE 50 MCG/ACT NA SUSP: 2.0000 | Freq: Every day | NASAL | Status: DC

## 2014-01-02 NOTE — ED Provider Notes (Signed)
CSN: [id number]     Arrival date & time 01/02/14  1639 History   First MD Initiated Contact with Patient 01/02/14 1656     Chief Complaint  Patient presents with  . Nasal Congestion   (Consider location/radiation/quality/duration/timing/severity/associated sxs/prior Treatment) HPI Comments: 28 year old male presents complaining of nasal congestion, scratchy throat, sinus congestion since this morning. He has had rhinorrhea for 3 days he this. He also has a very mild dry cough. No other systemic symptoms. No treatment to try at home.   Past Medical History  Diagnosis Date  . Schizophrenia   . Psychosis   . Bipolar affective disorder 2008   History reviewed. No pertinent past surgical history. No family history on file. History  Substance Use Topics  . Smoking status: Current Some Day Smoker -- 0.25 packs/day for 5 years    Types: Cigarettes  . Smokeless tobacco: Not on file  . Alcohol Use: 1.2 oz/week    2 Cans of beer per week     Comment: 2 Xs/week    Review of Systems  Constitutional: Negative for fever, chills and fatigue.  HENT: Positive for congestion, rhinorrhea, sinus pressure and sore throat. Negative for postnasal drip.   Eyes: Negative for discharge, itching and visual disturbance.  Respiratory: Positive for cough. Negative for chest tightness and shortness of breath.   Cardiovascular: Negative for chest pain, palpitations and leg swelling.  Gastrointestinal: Negative for nausea, vomiting, abdominal pain, diarrhea and constipation.  Genitourinary: Negative for dysuria, urgency, frequency and hematuria.  Musculoskeletal: Negative for arthralgias, myalgias, neck pain and neck stiffness.  Skin: Negative for rash.  Neurological: Negative for dizziness, weakness and light-headedness.    Allergies  Review of patient's allergies indicates no known allergies.  Home Medications   Current Outpatient Rx  Name  Route  Sig  Dispense  Refill  . divalproex (DEPAKOTE) 500 MG  DR tablet   Oral   Take 2 tablets (1,000 mg total) by mouth at bedtime.   60 tablet   0   . fexofenadine-pseudoephedrine (ALLEGRA-D) 60-120 MG per tablet   Oral   Take 1 tablet by mouth every 12 (twelve) hours.   30 tablet   0   . fluticasone (FLONASE) 50 MCG/ACT nasal spray   Each Nare   Place 2 sprays into both nostrils daily.   1 g   2   . methylPREDNISolone (MEDROL DOSEPAK) 4 MG tablet      Use as directed on package instructions   21 tablet   0   . OLANZapine (ZYPREXA) 10 MG tablet   Oral   Take 1 tablet (10 mg total) by mouth at bedtime.   10 tablet   0   . oxymetazoline (AFRIN NASAL SPRAY) 0.05 % nasal spray   Each Nare   Place 1 spray into both nostrils 2 (two) times daily.   30 mL   0   . QUEtiapine (SEROQUEL XR) 400 MG 24 hr tablet   Oral   Take 1 tablet (400 mg total) by mouth daily.   30 tablet   0    BP 139/93  Pulse 96  Temp(Src) 98.9 F (37.2 C) (Oral)  Resp 16  SpO2 98% Physical Exam  Nursing note and vitals reviewed. Constitutional: He is oriented to person, place, and time. He appears well-developed and well-nourished. No distress.  HENT:  Head: Normocephalic and atraumatic.  Nose: Mucosal edema and rhinorrhea present. Right sinus exhibits no maxillary sinus tenderness and no frontal sinus tenderness. Left sinus  exhibits no maxillary sinus tenderness and no frontal sinus tenderness.  Eyes: Conjunctivae are normal. Right eye exhibits no discharge. Left eye exhibits no discharge.  Neck: Normal range of motion. Neck supple.  Cardiovascular: Normal rate, regular rhythm and normal heart sounds.  Exam reveals no gallop and no friction rub.   No murmur heard. Pulmonary/Chest: Effort normal and breath sounds normal. No respiratory distress. He has no wheezes. He has no rales.  Lymphadenopathy:    He has no cervical adenopathy.  Neurological: He is alert and oriented to person, place, and time. Coordination normal.  Skin: Skin is warm and dry.  No rash noted. He is not diaphoretic.  Psychiatric: He has a normal mood and affect. Judgment normal.    ED Course  Procedures (including critical care time) Labs Review Labs Reviewed  POCT RAPID STREP A ([facility] URG CARE ONLY)   Imaging Review No results found.   MDM   1. Allergic rhinitis    No sign of bacterial infection.  Treating, f/u if no improvement.     Meds ordered this encounter  Medications  . methylPREDNISolone (MEDROL DOSEPAK) 4 MG tablet    Sig: Use as directed on package instructions    Dispense:  21 tablet    Refill:  0    Order Specific Question:  Supervising Provider    Answer:  Linna HoffKINDL, JAMES D [phone][[id number]]  . fluticasone (FLONASE) 50 MCG/ACT nasal spray    Sig: Place 2 sprays into both nostrils daily.    Dispense:  1 g    Refill:  2    Order Specific Question:  Supervising Provider    Answer:  Linna HoffKINDL, JAMES D [phone][[id number]]  . fexofenadine-pseudoephedrine (ALLEGRA-D) 60-120 MG per tablet    Sig: Take 1 tablet by mouth every 12 (twelve) hours.    Dispense:  30 tablet    Refill:  0    Order Specific Question:  Supervising Provider    Answer:  Linna HoffKINDL, JAMES D [phone][[id number]]  . oxymetazoline (AFRIN NASAL SPRAY) 0.05 % nasal spray    Sig: Place 1 spray into both nostrils 2 (two) times daily.    Dispense:  30 mL    Refill:  0    Order Specific Question:  Supervising Provider    Answer:  Bradd CanaryKINDL, JAMES D [[id number]]       Isaac GoodZachary H Bryar Rennie, PA-C 01/02/14 1747

## 2014-01-02 NOTE — ED Provider Notes (Signed)
Medical screening examination/treatment/procedure(s) were performed by non-physician practitioner and as supervising physician I was immediately available for consultation/collaboration.  Sevana Grandinetti, M.D.   Kniyah Khun C Lakeyn Dokken, MD 01/02/14 2136 

## 2014-01-02 NOTE — ED Notes (Signed)
Patient complains of congestion Cant taste anything Throat is scratchy Head is stuffy

## 2014-01-02 NOTE — Discharge Instructions (Signed)
Allergic Rhinitis Allergic rhinitis is when the mucous membranes in the nose respond to allergens. Allergens are particles in the air that cause your body to have an allergic reaction. This causes you to release allergic antibodies. Through a chain of events, these eventually cause you to release histamine into the blood stream. Although meant to protect the body, it is this release of histamine that causes your discomfort, such as frequent sneezing, congestion, and an itchy, runny nose.  CAUSES  Seasonal allergic rhinitis (hay fever) is caused by pollen allergens that may come from grasses, trees, and weeds. Year-round allergic rhinitis (perennial allergic rhinitis) is caused by allergens such as house dust mites, pet dander, and mold spores.  SYMPTOMS   Nasal stuffiness (congestion).  Itchy, runny nose with sneezing and tearing of the eyes. DIAGNOSIS  Your health care provider can help you determine the allergen or allergens that trigger your symptoms. If you and your health care provider are unable to determine the allergen, skin or blood testing may be used. TREATMENT  Allergic Rhinitis does not have a cure, but it can be controlled by:  Medicines and allergy shots (immunotherapy).  Avoiding the allergen. Hay fever may often be treated with antihistamines in pill or nasal spray forms. Antihistamines block the effects of histamine. There are over-the-counter medicines that may help with nasal congestion and swelling around the eyes. Check with your health care provider before taking or giving this medicine.  If avoiding the allergen or the medicine prescribed do not work, there are many new medicines your health care provider can prescribe. Stronger medicine may be used if initial measures are ineffective. Desensitizing injections can be used if medicine and avoidance does not work. Desensitization is when a patient is given ongoing shots until the body becomes less sensitive to the allergen.  Make sure you follow up with your health care provider if problems continue. HOME CARE INSTRUCTIONS It is not possible to completely avoid allergens, but you can reduce your symptoms by taking steps to limit your exposure to them. It helps to know exactly what you are allergic to so that you can avoid your specific triggers. SEEK MEDICAL CARE IF:   You have a fever.  You develop a cough that does not stop easily (persistent).  You have shortness of breath.  You start wheezing.  Symptoms interfere with normal daily activities. Document Released: 06/13/2001 Document Revised: 07/09/2013 Document Reviewed: 05/26/2013 Exit[employer] Patient Information 2014 [employer].  

## 2014-01-04 LAB — CULTURE, GROUP A STREP

## 2019-10-17 ENCOUNTER — Ambulatory Visit: Payer: Self-pay | Attending: Internal Medicine

## 2019-10-17 DIAGNOSIS — Z20822 Contact with and (suspected) exposure to covid-19: Secondary | ICD-10-CM | POA: Insufficient documentation

## 2019-10-18 LAB — NOVEL CORONAVIRUS, NAA: SARS-CoV-2, NAA: NOT DETECTED

## 2021-03-20 DIAGNOSIS — Z5321 Procedure and treatment not carried out due to patient leaving prior to being seen by health care provider: Secondary | ICD-10-CM | POA: Insufficient documentation

## 2021-03-20 DIAGNOSIS — M25552 Pain in left hip: Secondary | ICD-10-CM | POA: Insufficient documentation

## 2021-03-21 ENCOUNTER — Encounter (HOSPITAL_COMMUNITY): Payer: Self-pay

## 2021-03-21 ENCOUNTER — Emergency Department (HOSPITAL_COMMUNITY)
Admission: EM | Admit: 2021-03-21 | Discharge: 2021-03-21 | Discharge status: Against Medical Advice | Payer: Self-pay | Attending: Emergency Medicine | Admitting: Emergency Medicine

## 2021-03-21 ENCOUNTER — Other Ambulatory Visit: Payer: Self-pay

## 2021-03-21 ENCOUNTER — Emergency Department (HOSPITAL_COMMUNITY): Payer: Self-pay

## 2021-03-21 MED ORDER — TETANUS-DIPHTH-ACELL PERTUSSIS 5-2.5-18.5 LF-MCG/0.5 IM SUSY: 0.5000 mL | PREFILLED_SYRINGE | Freq: Once | INTRAMUSCULAR | Status: DC

## 2021-03-21 MED ORDER — OXYCODONE-ACETAMINOPHEN 5-325 MG PO TABS
2.0000 | ORAL_TABLET | Freq: Once | ORAL | Status: AC
  Administered 2021-03-21: 2 via ORAL
  Filled 2021-03-21: qty 2

## 2021-03-21 NOTE — ED Notes (Signed)
Pt begun arguing with another patient about who was sicker. Pt cursing and yelling loudly. Pt blasting explicit music after being asked multiple times to stop.
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
## 2021-03-21 NOTE — ED Notes (Signed)
Patient's mother called to come get patient due to patient's behavior

## 2021-03-21 NOTE — ED Notes (Signed)
Pt's mother requests we call her if we need anything & when pt is brought back to room.

## 2021-03-21 NOTE — ED Notes (Signed)
Pt disturbing others in waiting room. Pt calling out at staff once asked to not laugh at other patients. Pt walked off and is now outside the waiting room.

## 2021-03-21 NOTE — ED Notes (Signed)
Pt had upset another visitor. Pt and visitor had begun yell at each other. When pt made statement involving "that woman's dead ass mother." This writer stepped in and attempted to separate them. Pt became up set and started yelling for security. Pt stated to security, "You weren't paying attention because you were sitting there snoozin'." Pt stated that this was "typical nigger shit." Pt continued to yell at this writer, the visitor, and security. This writer attempted to put pt against the wall, however pt turned around and pull the wheelchair squeeze handle apart while this writer was squeezing it. Pt then decided to go outside had as been sitting outside since.
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
## 2021-03-23 ENCOUNTER — Ambulatory Visit (HOSPITAL_COMMUNITY)
Admission: EM | Admit: 2021-03-23 | Discharge: 2021-03-24 | Disposition: A | Discharge status: Home / Self Care | Payer: No Payment, Other | Attending: Family | Admitting: Family

## 2021-03-23 ENCOUNTER — Other Ambulatory Visit: Payer: Self-pay

## 2021-03-23 DIAGNOSIS — F17210 Nicotine dependence, cigarettes, uncomplicated: Secondary | ICD-10-CM | POA: Diagnosis not present

## 2021-03-23 DIAGNOSIS — Z20822 Contact with and (suspected) exposure to covid-19: Secondary | ICD-10-CM | POA: Insufficient documentation

## 2021-03-23 DIAGNOSIS — F1290 Cannabis use, unspecified, uncomplicated: Secondary | ICD-10-CM | POA: Insufficient documentation

## 2021-03-23 DIAGNOSIS — F909 Attention-deficit hyperactivity disorder, unspecified type: Secondary | ICD-10-CM | POA: Insufficient documentation

## 2021-03-23 DIAGNOSIS — F1020 Alcohol dependence, uncomplicated: Secondary | ICD-10-CM | POA: Insufficient documentation

## 2021-03-23 DIAGNOSIS — F29 Unspecified psychosis not due to a substance or known physiological condition: Secondary | ICD-10-CM | POA: Diagnosis not present

## 2021-03-23 LAB — LIPID PANEL
Cholesterol: 168 mg/dL (ref 0–200)
HDL: 85 mg/dL (ref >40)
LDL Cholesterol: 72 mg/dL (ref 0–99)
Total CHOL/HDL Ratio: 2 RATIO
Triglycerides: 56 mg/dL (ref <150)
VLDL: 11 mg/dL (ref 0–40)

## 2021-03-23 LAB — RESP PANEL BY RT-PCR (FLU A&B, COVID) ARPGX2
Influenza A by PCR: NEGATIVE
Influenza B by PCR: NEGATIVE
SARS Coronavirus 2 by RT PCR: NEGATIVE

## 2021-03-23 LAB — CBC WITH DIFFERENTIAL/PLATELET
Abs Immature Granulocytes: 0.03 10*3/uL (ref 0.00–0.07)
Basophils Absolute: 0.1 10*3/uL (ref 0.0–0.1)
Basophils Relative: 1 %
Eosinophils Absolute: 0.1 10*3/uL (ref 0.0–0.5)
Eosinophils Relative: 1 %
HCT: 36.6 % — ABNORMAL LOW (ref 39.0–52.0)
Hemoglobin: 12.3 g/dL — ABNORMAL LOW (ref 13.0–17.0)
Immature Granulocytes: 0 %
Lymphocytes Relative: 19 %
Lymphs Abs: 1.4 10*3/uL (ref 0.7–4.0)
MCH: 31.1 pg (ref 26.0–34.0)
MCHC: 33.6 g/dL (ref 30.0–36.0)
MCV: 92.4 fL (ref 80.0–100.0)
Monocytes Absolute: 0.5 10*3/uL (ref 0.1–1.0)
Monocytes Relative: 7 %
Neutro Abs: 5.2 10*3/uL (ref 1.7–7.7)
Neutrophils Relative %: 72 %
Platelets: 255 10*3/uL (ref 150–400)
RBC: 3.96 MIL/uL — ABNORMAL LOW (ref 4.22–5.81)
RDW: 12.4 % (ref 11.5–15.5)
WBC: 7.3 10*3/uL (ref 4.0–10.5)
nRBC: 0 % (ref 0.0–0.2)

## 2021-03-23 LAB — COMPREHENSIVE METABOLIC PANEL
ALT: 17 U/L (ref 0–44)
AST: 32 U/L (ref 15–41)
Albumin: 3.8 g/dL (ref 3.5–5.0)
Alkaline Phosphatase: 46 U/L (ref 38–126)
Anion gap: 6 (ref 5–15)
BUN: 14 mg/dL (ref 6–20)
CO2: 29 mmol/L (ref 22–32)
Calcium: 9.5 mg/dL (ref 8.9–10.3)
Chloride: 105 mmol/L (ref 98–111)
Creatinine, Ser: 1.1 mg/dL (ref 0.61–1.24)
GFR, Estimated: >60 mL/min (ref >60)
Glucose, Bld: 125 mg/dL — ABNORMAL HIGH (ref 70–99)
Potassium: 3.9 mmol/L (ref 3.5–5.1)
Sodium: 140 mmol/L (ref 135–145)
Total Bilirubin: 0.9 mg/dL (ref 0.3–1.2)
Total Protein: 6.4 g/dL — ABNORMAL LOW (ref 6.5–8.1)

## 2021-03-23 LAB — MAGNESIUM: Magnesium: 2.2 mg/dL (ref 1.7–2.4)

## 2021-03-23 LAB — POC SARS CORONAVIRUS 2 AG -  ED: SARS Coronavirus 2 Ag: NEGATIVE

## 2021-03-23 LAB — HEMOGLOBIN A1C
Hgb A1c MFr Bld: 5.6 % (ref 4.8–5.6)
Mean Plasma Glucose: 114.02 mg/dL

## 2021-03-23 LAB — ETHANOL: Alcohol, Ethyl (B): <10 mg/dL (ref <10)

## 2021-03-23 LAB — TSH: TSH: 0.652 u[IU]/mL (ref 0.350–4.500)

## 2021-03-23 MED ORDER — HYDROXYZINE HCL 25 MG PO TABS: 25.0000 mg | ORAL_TABLET | Freq: Three times a day (TID) | ORAL | Status: DC | PRN

## 2021-03-23 MED ORDER — HALOPERIDOL LACTATE 5 MG/ML IJ SOLN
10.0000 mg | Freq: Three times a day (TID) | INTRAMUSCULAR | Status: DC | PRN
  Administered 2021-03-23: 10 mg via INTRAMUSCULAR
  Filled 2021-03-23: qty 2

## 2021-03-23 MED ORDER — ALUM & MAG HYDROXIDE-SIMETH 200-200-20 MG/5ML PO SUSP: 30.0000 mL | ORAL | Status: DC | PRN

## 2021-03-23 MED ORDER — LORAZEPAM 2 MG/ML IJ SOLN
2.0000 mg | Freq: Three times a day (TID) | INTRAMUSCULAR | Status: DC | PRN
  Administered 2021-03-23: 2 mg via INTRAMUSCULAR
  Filled 2021-03-23: qty 1

## 2021-03-23 MED ORDER — MAGNESIUM HYDROXIDE 400 MG/5ML PO SUSP: 30.0000 mL | Freq: Every day | ORAL | Status: DC | PRN

## 2021-03-23 MED ORDER — LORAZEPAM 1 MG PO TABS: 2.0000 mg | ORAL_TABLET | Freq: Three times a day (TID) | ORAL | Status: DC | PRN

## 2021-03-23 MED ORDER — ACETAMINOPHEN 325 MG PO TABS
650.0000 mg | ORAL_TABLET | Freq: Four times a day (QID) | ORAL | Status: DC | PRN
  Administered 2021-03-24: 650 mg via ORAL
  Filled 2021-03-23: qty 2

## 2021-03-23 MED ORDER — BENZTROPINE MESYLATE 1 MG PO TABS: 1.0000 mg | ORAL_TABLET | Freq: Three times a day (TID) | ORAL | Status: DC | PRN

## 2021-03-23 MED ORDER — HALOPERIDOL 5 MG PO TABS: 10.0000 mg | ORAL_TABLET | Freq: Three times a day (TID) | ORAL | Status: DC | PRN

## 2021-03-23 MED ORDER — BENZTROPINE MESYLATE 1 MG/ML IJ SOLN
1.0000 mg | Freq: Three times a day (TID) | INTRAMUSCULAR | Status: DC | PRN
  Administered 2021-03-23: 1 mg via INTRAMUSCULAR
  Filled 2021-03-23: qty 2

## 2021-03-23 MED ORDER — TRAZODONE HCL 50 MG PO TABS: 50.0000 mg | ORAL_TABLET | Freq: Every evening | ORAL | Status: DC | PRN
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
## 2021-03-24 NOTE — ED Notes (Signed)
Pt sleeping resp 16 and easy no distress noted

## 2021-03-24 NOTE — ED Notes (Signed)
Pt given snack. 
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
## 2021-03-24 NOTE — ED Notes (Addendum)
GIVEN BREAKFAST. 2 cereals, 2 nutri-grain bars, fruit cup.

## 2021-03-24 NOTE — Progress Notes (Signed)
[facility] [place]  order. He was medicated prior with tylenol for pain and received breakfast.

## 2021-03-24 NOTE — Discharge Instructions (Addendum)

## 2021-03-24 NOTE — Progress Notes (Signed)
Patient has been referred out due to no bed availability at [facility]. Patient meets inpatient criteria per Inetta Fermo Allen,NP. Patient referred to the following facilities:  [facility]  [address] [place] [address] [phone]  [facility]  [address] [place] [address] [phone]  [facility]  [address] [place] [address] [phone]  [facility]  [address], [place] [address] [phone]  [facility]  [address] [place] [address] [phone]  [facility]  [address] [place] [address] [phone]  [facility]  [address] [place], [place] [address] [phone]  [facility]  [address] [place], [place] [address] [phone]  [facility]  [address] [place] [address] [phone]  [facility]  [address], [place] [address] [phone]  [facility]  [address], [place] [address] [phone]    CSW will continue to monitor disposition.     Damita Dunnings, MSW, LCSW-A  10:29 AM 03/24/2021

## 2021-03-25 LAB — PROLACTIN: Prolactin: 113 ng/mL — ABNORMAL HIGH (ref 4.0–15.2)

## 2021-05-01 ENCOUNTER — Emergency Department (HOSPITAL_COMMUNITY)
Admission: EM | Admit: 2021-05-01 | Discharge: 2021-05-02 | Disposition: A | Discharge status: Home / Self Care | Payer: Self-pay | Attending: Emergency Medicine | Admitting: Emergency Medicine

## 2021-05-01 ENCOUNTER — Encounter (HOSPITAL_COMMUNITY): Payer: Self-pay

## 2021-05-01 ENCOUNTER — Other Ambulatory Visit: Payer: Self-pay

## 2021-05-01 DIAGNOSIS — L739 Follicular disorder, unspecified: Secondary | ICD-10-CM | POA: Insufficient documentation

## 2021-05-01 DIAGNOSIS — F17210 Nicotine dependence, cigarettes, uncomplicated: Secondary | ICD-10-CM | POA: Insufficient documentation

## 2021-05-01 NOTE — ED Triage Notes (Signed)
Pt has cyst on his abdomen by his umbilicus that he states has been there for 10 days. Pt poked it and removed some pus from it. Pt has another small bump above the cyst.

## 2021-05-02 MED ORDER — DOXYCYCLINE HYCLATE 100 MG PO TABS
100.0000 mg | ORAL_TABLET | Freq: Once | ORAL | Status: AC
  Administered 2021-05-02: 100 mg via ORAL
  Filled 2021-05-02: qty 1

## 2021-05-02 MED ORDER — DOXYCYCLINE HYCLATE 100 MG PO CAPS: 100.0000 mg | ORAL_CAPSULE | Freq: Two times a day (BID) | ORAL | 0 refills | Status: AC

## 2021-05-02 NOTE — Discharge Instructions (Addendum)
Take the prescribed medication as directed.  Continue warm compresses at home at least twice daily. Do not shave chest or abdomen again until wound fully heals. Follow-up with the wellness clinic for wound check-- call in the morning to get this scheduled. Return to the ED for new or worsening symptoms.

## 2021-05-02 NOTE — ED Provider Notes (Signed)
[facility] [place] [place]  HOSPITAL-EMERGENCY DEPT Provider Note   CSN: [id number] Arrival date & time: 05/01/21  2207     History Chief Complaint  Patient presents with   Cyst    Isaac Cabrera is a 35 y.o. male.  The history is provided by the patient and medical records.   35 year old male with history of bipolar disorder, schizophrenia, presenting to the ED with wound to the left of his navel.  States this began about 10 days ago after he shaved his stomach and chest.  He does this regularly.  States he thought initially it was an ingrown hair.  States seemed to develop somewhat of an abscess, he was able to pop this and pus drained out.  States he has been doing warm compresses at home.  Today, noticed new area that developed a few inches above this and was concerned.  He has not had any fevers.  Denies any history of diabetes or MRSA.  Past Medical History:  Diagnosis Date   Bipolar affective disorder (HCC) 2008   Psychosis (HCC)    Schizophrenia [facility])     Patient Active Problem List   Diagnosis Date Noted   Unspecified episodic mood disorder 08/12/2012   Delusional disorder(297.1) 08/12/2012    History reviewed. No pertinent surgical history.     History reviewed. No pertinent family history.  Social History   Tobacco Use   Smoking status: Some Days    Packs/day: 0.25    Years: 5.00    Pack years: 1.25    Types: Cigarettes   Smokeless tobacco: Never  Substance Use Topics   Alcohol use: Yes    Alcohol/week: 2.0 standard drinks    Types: 2 Cans of beer per week    Comment: 2 Xs/week   Drug use: No    Home Medications Prior to Admission medications   Medication Sig Start Date End Date Taking? Authorizing Provider  doxycycline (VIBRAMYCIN) 100 MG capsule Take 1 capsule (100 mg total) by mouth 2 (two) times daily. 05/02/21  Yes Garlon Hatchet, PA-C  divalproex (DEPAKOTE) 500 MG DR tablet Take 2 tablets (1,000 mg total) by mouth at bedtime. 08/19/12 03/24/21   Norval Gable, NP  fluticasone (FLONASE) 50 MCG/ACT nasal spray Place 2 sprays into both nostrils daily. 01/02/14 03/24/21  Graylon Good, PA-C  OLANZapine (ZYPREXA) 10 MG tablet Take 1 tablet (10 mg total) by mouth at bedtime. 09/03/12 03/24/21  Leata Mouse, MD  QUEtiapine (SEROQUEL XR) 400 MG 24 hr tablet Take 1 tablet (400 mg total) by mouth daily. 08/19/12 03/24/21  Norval Gable, NP    Allergies    Patient has no known allergies.  Review of Systems   Review of Systems  Skin:  Positive for wound.  All other systems reviewed and are negative.  Physical Exam Updated Vital Signs BP 121/83 (BP Location: Left Arm)   Pulse 78   Temp 98.4 F (36.9 C) (Oral)   Resp 16   Ht 6\' 3"  (1.905 m)   Wt 81.6 kg   SpO2 96%   BMI 22.50 kg/m   Physical Exam Vitals and nursing note reviewed.  Constitutional:      Appearance: He is well-developed.  HENT:     Head: Normocephalic and atraumatic.  Eyes:     Conjunctiva/sclera: Conjunctivae normal.     Pupils: Pupils are equal, round, and reactive to light.  Cardiovascular:     Rate and Rhythm: Normal rate and regular rhythm.     Heart sounds: Normal  heart sounds.  Pulmonary:     Effort: Pulmonary effort is normal.     Breath sounds: Normal breath sounds.  Abdominal:     General: Bowel sounds are normal.     Palpations: Abdomen is soft.     Comments: Wound noted just to the left of the navel, there is some crusting without active drainage, no surrounding cellulitis changes, secondary pustule noted a few inches above this also without surrounding induration/cellulitis  Musculoskeletal:        General: Normal range of motion.     Cervical back: Normal range of motion.  Skin:    General: Skin is warm and dry.  Neurological:     Mental Status: He is alert and oriented to person, place, and time.    ED Results / Procedures / Treatments   Labs (all labs ordered are listed, but only abnormal results are displayed) Labs  Reviewed - No data to display  EKG None  Radiology No results found.  Procedures Procedures   Medications Ordered in ED Medications  doxycycline (VIBRA-TABS) tablet 100 mg (100 mg Oral Given 05/02/21 0037)    ED Course  I have reviewed the triage vital signs and the nursing notes.  Pertinent labs & imaging results that were available during my care of the patient were reviewed by me and considered in my medical decision making (see chart for details).    MDM Rules/Calculators/A&P                           35 year old male here with wound to left side of navel for the past 10 days.  This developed about 2 days after shaving his abdomen and chest.  Has reported some purulent drainage after he manipulated it at home.  He is afebrile and nontoxic in appearance here.  Wound present to left of navel, appears crusted over without active drainage.  There is no underlying fluctuance or surrounding cellulitis of the abdominal wall.  He does have secondary pustule a few inches above this that is also without surrounding cellulitis.  I suspect this is likely a folliculitis as it occurred directly after shaving, likely made worse by his self manipulation at home.  He has no history of diabetes or MRSA.  Will start on course of doxycycline, encouraged warm compresses at home.  He does not currently have a primary care doctor so we will try to arrange follow-up at the wellness clinic.  Encouraged to return here for any new or acute changes.  Final Clinical Impression(s) / ED Diagnoses Final diagnoses:  Folliculitis    Rx / DC Orders ED Discharge Orders          Ordered    doxycycline (VIBRAMYCIN) 100 MG capsule  2 times daily        05/02/21 0043             Garlon Hatchet, PA-C 05/02/21 0044    Tilden Fossa, MD 05/02/21 [id number]

## 2021-08-21 ENCOUNTER — Emergency Department (HOSPITAL_COMMUNITY)
Admission: EM | Admit: 2021-08-21 | Discharge: 2021-08-21 | Discharge status: Against Medical Advice | Payer: Self-pay | Attending: Emergency Medicine | Admitting: Emergency Medicine

## 2021-08-21 DIAGNOSIS — Y906 Blood alcohol level of 120-199 mg/100 ml: Secondary | ICD-10-CM | POA: Insufficient documentation

## 2021-08-21 DIAGNOSIS — Z79899 Other long term (current) drug therapy: Secondary | ICD-10-CM | POA: Insufficient documentation

## 2021-08-21 DIAGNOSIS — R456 Violent behavior: Secondary | ICD-10-CM | POA: Insufficient documentation

## 2021-08-21 DIAGNOSIS — F17210 Nicotine dependence, cigarettes, uncomplicated: Secondary | ICD-10-CM | POA: Insufficient documentation

## 2021-08-21 DIAGNOSIS — R4689 Other symptoms and signs involving appearance and behavior: Secondary | ICD-10-CM

## 2021-08-21 LAB — COMPREHENSIVE METABOLIC PANEL
ALT: 22 U/L (ref 0–44)
AST: 42 U/L — ABNORMAL HIGH (ref 15–41)
Albumin: 4.7 g/dL (ref 3.5–5.0)
Alkaline Phosphatase: 101 U/L (ref 38–126)
Anion gap: 11 (ref 5–15)
BUN: 12 mg/dL (ref 6–20)
CO2: 22 mmol/L (ref 22–32)
Calcium: 9.6 mg/dL (ref 8.9–10.3)
Chloride: 105 mmol/L (ref 98–111)
Creatinine, Ser: 1.4 mg/dL — ABNORMAL HIGH (ref 0.61–1.24)
GFR, Estimated: >60 mL/min (ref >60)
Glucose, Bld: 95 mg/dL (ref 70–99)
Potassium: 3.6 mmol/L (ref 3.5–5.1)
Sodium: 138 mmol/L (ref 135–145)
Total Bilirubin: 1 mg/dL (ref 0.3–1.2)
Total Protein: 8.2 g/dL — ABNORMAL HIGH (ref 6.5–8.1)

## 2021-08-21 LAB — CBC WITH DIFFERENTIAL/PLATELET
Abs Immature Granulocytes: 0.02 10*3/uL (ref 0.00–0.07)
Basophils Absolute: 0.1 10*3/uL (ref 0.0–0.1)
Basophils Relative: 1 %
Eosinophils Absolute: 0.1 10*3/uL (ref 0.0–0.5)
Eosinophils Relative: 1 %
HCT: 49 % (ref 39.0–52.0)
Hemoglobin: 16.8 g/dL (ref 13.0–17.0)
Immature Granulocytes: 0 %
Lymphocytes Relative: 23 %
Lymphs Abs: 1.9 10*3/uL (ref 0.7–4.0)
MCH: 30.4 pg (ref 26.0–34.0)
MCHC: 34.3 g/dL (ref 30.0–36.0)
MCV: 88.8 fL (ref 80.0–100.0)
Monocytes Absolute: 0.5 10*3/uL (ref 0.1–1.0)
Monocytes Relative: 6 %
Neutro Abs: 5.6 10*3/uL (ref 1.7–7.7)
Neutrophils Relative %: 69 %
Platelets: 265 10*3/uL (ref 150–400)
RBC: 5.52 MIL/uL (ref 4.22–5.81)
RDW: 12.7 % (ref 11.5–15.5)
WBC: 8.1 10*3/uL (ref 4.0–10.5)
nRBC: 0 % (ref 0.0–0.2)

## 2021-08-21 LAB — ETHANOL: Alcohol, Ethyl (B): 123 mg/dL — ABNORMAL HIGH (ref <10)

## 2021-08-21 MED ORDER — ZIPRASIDONE MESYLATE 20 MG IM SOLR
20.0000 mg | Freq: Once | INTRAMUSCULAR | Status: DC
  Filled 2021-08-21: qty 20
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
## 2021-09-22 ENCOUNTER — Emergency Department (HOSPITAL_COMMUNITY)
Admission: EM | Admit: 2021-09-22 | Discharge: 2021-09-23 | Disposition: A | Discharge status: Home / Self Care | Payer: Self-pay | Attending: Emergency Medicine | Admitting: Emergency Medicine

## 2021-09-22 ENCOUNTER — Encounter (HOSPITAL_COMMUNITY): Payer: Self-pay

## 2021-09-22 ENCOUNTER — Other Ambulatory Visit: Payer: Self-pay

## 2021-09-22 DIAGNOSIS — F17210 Nicotine dependence, cigarettes, uncomplicated: Secondary | ICD-10-CM | POA: Insufficient documentation

## 2021-09-22 DIAGNOSIS — F1099 Alcohol use, unspecified with unspecified alcohol-induced disorder: Secondary | ICD-10-CM | POA: Insufficient documentation

## 2021-09-22 DIAGNOSIS — R443 Hallucinations, unspecified: Secondary | ICD-10-CM | POA: Insufficient documentation

## 2021-09-22 NOTE — ED Triage Notes (Addendum)
Patient BIB [facility] from [facility]. Patient wanted to sleep at [facility], he is homeless. En route patient mentioned hallucinations. Patient does not have SI/HI. Patient is homeless, lost his car, lost his job.

## 2021-09-22 NOTE — ED Provider Notes (Signed)
Emergency Medicine Provider Triage Evaluation Note  Master Touchet , a 35 y.o. male  was evaluated in triage.  Pt brought in by EMS from [facility].  Patient is homeless with history of psychosis.  Patient states that he has "a lot of stressors in his life."  He states that he has lost his car, his job, that he is homeless and broke up with his girlfriend.  He states that he is "surveying a lot of things."  He states that he does hear voices in his head multiple times a day.  Patient speaks fast in the room and somewhat tangentially.  He denies SI/HI or visual hallucinations.  He states that he has not slept in almost 2 days.  Endorses alcohol use.  Denies substance abuse. Denies acute pain.   Review of Systems  Positive:  Negative:   Physical Exam  BP (!) 139/101 (BP Location: Right Arm)    Pulse 82    Temp 98 F (36.7 C) (Oral)    Resp 16    Ht 6\' 3"  (1.905 m)    Wt 81.6 kg    SpO2 100%    BMI 22.50 kg/m  Gen:   Awake, no distress   Resp:  Normal effort MSK:   Moves extremities without difficulty  Other:  Alert and oriented.  Patient with fast speech.  Not acutely agitated.  Speaks tangentially.  He does have moments of paranoia stating that he has been "surveying a lot of things" and "I do not trust anybody without paper, you don't have papers I am not really interested in talking with you a lot."  Medical Decision Making  Medically screening exam initiated at 9:41 PM.  Appropriate orders placed.  Mahari Strahm was informed that the remainder of the evaluation will be completed by another provider, this initial triage assessment does not replace that evaluation, and the importance of remaining in the ED until their evaluation is complete.  Medical clearance put in   Gwendlyn Deutscher 09/22/21 2145    2146, MD 10/05/21 1540

## 2021-09-22 NOTE — ED Notes (Signed)
Pt is refusing to stay in his room and wants to wait in the lobby in case his ride shows up

## 2021-09-22 NOTE — ED Notes (Signed)
Pt is refusing to let staff obtain labs or perform an ekg.

## 2021-09-23 NOTE — ED Notes (Signed)
Attempted to call safe transport twice to provide a ride to the [facility].

## 2021-09-23 NOTE — ED Provider Notes (Signed)
[place] DEPT Provider Note  CSN: [id number] Arrival date & time: 09/22/21 2126  Chief Complaint(s) Psychiatric Evaluation  HPI Isaac Cabrera is a 35 y.o. male   The history is provided by the patient.  Mental Health Problem Presenting symptoms: hallucinations   Presenting symptoms: no aggressive behavior, no agitation, no delusions, no disorganized speech, no homicidal ideas, no paranoid behavior, no self-mutilation, no suicidal thoughts, no suicidal threats and no suicide attempt   Degree of incapacity (severity):  Mild Onset quality:  Gradual Timing:  Intermittent Progression:  Waxing and waning Chronicity:  Recurrent Context: alcohol use   Associated symptoms: no anxiety, no fatigue, no hypersomnia and no irritability   States that he is looking to get a therapist and Education officer, [employer].  Past Medical History Past Medical History:  Diagnosis Date   Bipolar affective disorder ([place]) 2008   Psychosis ([place])    Schizophrenia [facility])    Patient Active Problem List   Diagnosis Date Noted   Unspecified episodic mood disorder 08/12/2012   Delusional disorder(297.1) 08/12/2012   Home Medication(s) Prior to Admission medications   Medication Sig Start Date End Date Taking? Authorizing Provider  doxycycline (VIBRAMYCIN) 100 MG capsule Take 1 capsule (100 mg total) by mouth 2 (two) times daily. 05/02/21   Larene Pickett, PA-C  divalproex (DEPAKOTE) 500 MG DR tablet Take 2 tablets (1,000 mg total) by mouth at bedtime. 08/19/12 03/24/21  Reece Packer, NP  fluticasone (FLONASE) 50 MCG/ACT nasal spray Place 2 sprays into both nostrils daily. 01/02/14 03/24/21  Liam Graham, PA-C  OLANZapine (ZYPREXA) 10 MG tablet Take 1 tablet (10 mg total) by mouth at bedtime. 09/03/12 03/24/21  Ambrose Finland, MD  QUEtiapine (SEROQUEL XR) 400 MG 24 hr tablet Take 1 tablet (400 mg total) by mouth daily. 08/19/12 03/24/21  Reece Packer, NP                                                                                                                                     Past Surgical History History reviewed. No pertinent surgical history. Family History History reviewed. No pertinent family history.  Social History Social History   Tobacco Use   Smoking status: Some Days    Packs/day: 0.25    Years: 5.00    Pack years: 1.25    Types: Cigarettes   Smokeless tobacco: Never  Substance Use Topics   Alcohol use: Yes    Alcohol/week: 2.0 standard drinks    Types: 2 Cans of beer per week    Comment: 2 Xs/week   Drug use: No   Allergies Patient has no known allergies.  Review of Systems Review of Systems  Constitutional:  Negative for fatigue and irritability.  Psychiatric/Behavioral:  Positive for hallucinations. Negative for agitation, homicidal ideas, paranoia, self-injury and suicidal ideas. The patient is not nervous/anxious.   All other systems are reviewed and are negative for acute change except  as noted in the HPI  Physical Exam Vital Signs  I have reviewed the triage vital signs BP (!) 139/101 (BP Location: Right Arm)    Pulse 82    Temp 98 F (36.7 C) (Oral)    Resp 16    Ht 6\' 3"  (1.905 m)    Wt 81.6 kg    SpO2 100%    BMI 22.50 kg/m   Physical Exam Vitals reviewed.  Constitutional:      General: He is not in acute distress.    Appearance: He is well-developed. He is not diaphoretic.  HENT:     Head: Normocephalic and atraumatic.     Nose: Nose normal.  Eyes:     General: No scleral icterus.       Right eye: No discharge.        Left eye: No discharge.     Conjunctiva/sclera: Conjunctivae normal.     Pupils: Pupils are equal, round, and reactive to light.  Cardiovascular:     Rate and Rhythm: Normal rate and regular rhythm.     Heart sounds: No murmur heard.   No friction rub. No gallop.  Pulmonary:     Effort: Pulmonary effort is normal. No respiratory distress.     Breath sounds: Normal breath sounds. No  stridor. No rales.  Abdominal:     General: There is no distension.     Palpations: Abdomen is soft.     Tenderness: There is no abdominal tenderness.  Musculoskeletal:        General: No tenderness.     Cervical back: Normal range of motion and neck supple.  Skin:    General: Skin is warm and dry.     Findings: No erythema or rash.  Neurological:     Mental Status: He is alert and oriented to person, place, and time.  Psychiatric:        Attention and Perception: Attention normal.        Mood and Affect: Mood and affect normal. Mood is not depressed. Affect is not labile or angry.        Speech: Speech normal.        Behavior: Behavior normal. Behavior is not agitated, withdrawn, hyperactive or combative.        Thought Content: Thought content is not paranoid or delusional. Thought content does not include homicidal or suicidal ideation.    ED Results and Treatments Labs (all labs ordered are listed, but only abnormal results are displayed) Labs Reviewed - No data to display                                                                                                                       EKG  EKG Interpretation  Date/Time:    Ventricular Rate:    PR Interval:    QRS Duration:   QT Interval:    QTC Calculation:   R Axis:     Text Interpretation:  Radiology No results found.  Pertinent labs & imaging results that were available during my care of the patient were reviewed by me and considered in my medical decision making (see MDM for details).  Medications Ordered in ED Medications - No data to display                                                                                                                                   Procedures Procedures  (including critical care time)  Medical Decision Making / ED Course I have reviewed the nursing notes for this encounter and the patient's prior records (if available in EHR or on provided  paperwork).  Mykle Matzen was evaluated in Emergency Department on 09/23/2021 for the symptoms described in the history of present illness. He was evaluated in the context of the global COVID-19 pandemic, which necessitated consideration that the patient might be at risk for infection with the SARS-CoV-2 virus that causes COVID-19. Institutional protocols and algorithms that pertain to the evaluation of patients at risk for COVID-19 are in a state of rapid change based on information released by regulatory bodies including the CDC and federal and state organizations. These policies and algorithms were followed during the patient's care in the ED.     H/o bipolar and schizophrenia here for intermittent hallucinations. Admitted to etoh earlier. No SI/HI No current hallcinations Patient does not appear to be a threat to himself or others.  He does not appear to be manic or in acute psychosis.Marland Kitchen Appropriate for outpatient management. Provided with resources  Pertinent labs & imaging results that were available during my care of the patient were reviewed by me and considered in my medical decision making:    Final Clinical Impression(s) / ED Diagnoses Final diagnoses:  Hallucination   The patient appears reasonably screened and/or stabilized for discharge and I doubt any other medical condition or other [facility] requiring further screening, evaluation, or treatment in the ED at this time prior to discharge. Safe for discharge with strict return precautions.  Disposition: Discharge  Condition: Good  I have discussed the results, Dx and Tx plan with the patient/family who expressed understanding and agree(s) with the plan. Discharge instructions discussed at length. The patient/family was given strict return precautions who verbalized understanding of the instructions. No further questions at time of discharge.    ED Discharge Orders     None        Follow Up: [facility] Address: [address], [place], [place] [address] Hours: Open 24 hours Monday through Sunday Phone: [phone] Go to    [place] [address] [phone]  Go to       This chart was dictated using voice recognition software.  Despite best efforts to proofread,  errors can occur which can change the documentation meaning.    Fatima Blank, MD 09/23/21 [phone]

## 2021-09-25 ENCOUNTER — Encounter (HOSPITAL_COMMUNITY): Payer: Self-pay | Admitting: Emergency Medicine

## 2021-09-25 ENCOUNTER — Emergency Department (HOSPITAL_COMMUNITY)
Admission: EM | Admit: 2021-09-25 | Discharge: 2021-09-25 | Disposition: A | Discharge status: Home / Self Care | Payer: Self-pay | Attending: Emergency Medicine | Admitting: Emergency Medicine

## 2021-09-25 ENCOUNTER — Other Ambulatory Visit: Payer: Self-pay

## 2021-09-25 DIAGNOSIS — R443 Hallucinations, unspecified: Secondary | ICD-10-CM

## 2021-09-25 DIAGNOSIS — F17210 Nicotine dependence, cigarettes, uncomplicated: Secondary | ICD-10-CM | POA: Insufficient documentation

## 2021-09-25 DIAGNOSIS — Z5900 Homelessness unspecified: Secondary | ICD-10-CM | POA: Insufficient documentation

## 2021-09-25 DIAGNOSIS — F209 Schizophrenia, unspecified: Secondary | ICD-10-CM | POA: Insufficient documentation

## 2021-09-25 NOTE — ED Notes (Signed)
Pt ambulatory without assistance.  

## 2021-09-25 NOTE — ED Provider Notes (Signed)
I provided a substantive portion of the care of this patient.  I personally performed the entirety of the medical decision making for this encounter.      Patient seen and examined here.  Has chronic hallucinations due to schizophrenia.  He denies any SI or HI.  States that the voices he hears are not telling him to harm himself or harm others.  He is seeking to get plugged into resources.  Seen 2 days ago for similar symptoms.  Patient's current psychiatric conditions appear to be chronic and baseline.  Does not appear to be experiencing acute psychiatric condition.  Will refer to behavioral health urgent care center   Lorre Nick, MD 09/25/21 [phone]

## 2021-09-25 NOTE — ED Notes (Signed)
Pt exited room and walked to ED meeting room. Pt claimed an employees belongings were his. Pt required repeat redirection back to room. Pt expressed that he felt like staff were "Getting to close to me, ya'll on that freaky shit"

## 2021-09-25 NOTE — ED Notes (Signed)
Pt left emergency department. Security and [employer] officer escorting pt to exit. Pt states "I wanna leave."

## 2021-09-25 NOTE — ED Notes (Signed)
Pt provided coffee and a ham sandwich.

## 2021-09-25 NOTE — ED Triage Notes (Signed)
Patient reports he wants to see his family for christmas but they are in [place]. He would also like some breakfast. Denies SI/HI. Gazes off to distance and speech trails off.

## 2021-09-25 NOTE — ED Notes (Signed)
Pt cooperative with staff, polite with no aggressive behavior.

## 2021-09-25 NOTE — ED Notes (Signed)
Pt repeatedly walking out of room, requiring repeat redirection. Pt stated "The voices are telling me i'm on lookdown, I don't want to stay in my room until you all put me down." [employer] officer assisting with redirection.

## 2021-09-25 NOTE — Discharge Instructions (Addendum)
Return to ED with any new or worsening symptoms such as voices telling you to harm others or yourself Please go to [facility] for further evaluation and care. The address is [address] [place]

## 2021-09-25 NOTE — ED Provider Notes (Signed)
[facility]-EMERGENCY DEPT Provider Note   CSN: [id number] Arrival date & time: 09/25/21  1914     History Chief Complaint  Patient presents with   Other    Asa Baudoin is a 35 y.o. male who presents to ED with no medical complaint.  Patient states that he is currently hearing voices.  Patient denies that these voices are telling him to hurt himself or others.  Patient denies visual hallucinations.  Patient also asking for belt and stating that he needs somewhere to stay because he is currently homeless.  Patient also requesting breakfast.    HPI     Past Medical History:  Diagnosis Date   Bipolar affective disorder (HCC) 2008   Psychosis (HCC)    Schizophrenia [facility])     Patient Active Problem List   Diagnosis Date Noted   Unspecified episodic mood disorder 08/12/2012   Delusional disorder(297.1) 08/12/2012    History reviewed. No pertinent surgical history.     No family history on file.  Social History   Tobacco Use   Smoking status: Some Days    Packs/day: 0.25    Years: 5.00    Pack years: 1.25    Types: Cigarettes   Smokeless tobacco: Never  Substance Use Topics   Alcohol use: Yes    Alcohol/week: 2.0 standard drinks    Types: 2 Cans of beer per week    Comment: 2 Xs/week   Drug use: No    Home Medications Prior to Admission medications   Medication Sig Start Date End Date Taking? Authorizing Provider  doxycycline (VIBRAMYCIN) 100 MG capsule Take 1 capsule (100 mg total) by mouth 2 (two) times daily. 05/02/21   Garlon Hatchet, PA-C  divalproex (DEPAKOTE) 500 MG DR tablet Take 2 tablets (1,000 mg total) by mouth at bedtime. 08/19/12 03/24/21  Norval Gable, NP  fluticasone (FLONASE) 50 MCG/ACT nasal spray Place 2 sprays into both nostrils daily. 01/02/14 03/24/21  Graylon Good, PA-C  OLANZapine (ZYPREXA) 10 MG tablet Take 1 tablet (10 mg total) by mouth at bedtime. 09/03/12 03/24/21  Leata Mouse, MD  QUEtiapine  (SEROQUEL XR) 400 MG 24 hr tablet Take 1 tablet (400 mg total) by mouth daily. 08/19/12 03/24/21  Norval Gable, NP    Allergies    Patient has no known allergies.  Review of Systems   Review of Systems  Psychiatric/Behavioral:  Positive for hallucinations. Negative for self-injury and suicidal ideas.   All other systems reviewed and are negative.  Physical Exam Updated Vital Signs BP (!) 133/99 (BP Location: Left Arm)    Pulse 77    Temp 98.5 F (36.9 C) (Oral)    Resp 18    SpO2 100%   Physical Exam Vitals and nursing note reviewed.  Constitutional:      General: He is not in acute distress.    Appearance: He is not toxic-appearing.  HENT:     Head: Normocephalic.     Nose: Nose normal.     Mouth/Throat:     Mouth: Mucous membranes are moist.  Eyes:     Extraocular Movements: Extraocular movements intact.     Pupils: Pupils are equal, round, and reactive to light.  Cardiovascular:     Rate and Rhythm: Normal rate and regular rhythm.  Pulmonary:     Effort: Pulmonary effort is normal.     Breath sounds: Normal breath sounds. No wheezing.  Abdominal:     General: Abdomen is flat.  Palpations: Abdomen is soft.     Tenderness: There is no abdominal tenderness.  Musculoskeletal:     Cervical back: Normal range of motion.  Skin:    General: Skin is warm and dry.     Capillary Refill: Capillary refill takes less than 2 seconds.  Neurological:     Mental Status: He is alert and oriented to person, place, and time.  Psychiatric:        Attention and Perception: He is inattentive. He perceives auditory hallucinations.        Mood and Affect: Affect is flat. Affect is not angry.        Speech: Speech normal.        Behavior: Behavior is slowed. Behavior is not agitated, aggressive or combative. Behavior is cooperative.        Thought Content: Thought content is not paranoid or delusional. Thought content does not include homicidal or suicidal ideation. Thought content  does not include homicidal or suicidal plan.        Judgment: Judgment normal.    ED Results / Procedures / Treatments   Labs (all labs ordered are listed, but only abnormal results are displayed) Labs Reviewed - No data to display  EKG None  Radiology No results found.  Procedures Procedures   Medications Ordered in ED Medications - No data to display  ED Course  I have reviewed the triage vital signs and the nursing notes.  Pertinent labs & imaging results that were available during my care of the patient were reviewed by me and considered in my medical decision making (see chart for details).    MDM Rules/Calculators/A&P                          88YOM with history of schizophrenia and bipolar disorder presents with no medical complaint. Patient denies pain. Patient states "I'm not sure why I'm here". Patient denies pain.   No aggressive behavior noted, no agitation, no disorganized speech, no homicidal ideas, no paranoid behavior, no self-mutilation, no suicidal thoughts, no suicidal threats, no suicidal attempt.  Patient appears calm and cooperative however is staring off into the distance and will ramble.  Patient also seen by Dr. Zenia Resides who feels this patient is appropriate for discharge with outpatient behavioral health urgent care follow-up.  Instruction provided in discharge paperwork.  When I went to give this patient his paperwork to be discharged, he had already left.   Final Clinical Impression(s) / ED Diagnoses Final diagnoses:  Homeless  Schizophrenia, unspecified type [facility])  Hallucination    Rx / DC Orders ED Discharge Orders     None        Lawana Chambers 09/25/21 1043    Lacretia Leigh, MD 09/26/21 [phone]

## 2021-10-23 ENCOUNTER — Emergency Department (HOSPITAL_COMMUNITY)
Admission: EM | Admit: 2021-10-23 | Discharge: 2021-10-23 | Disposition: A | Discharge status: Home / Self Care | Payer: Self-pay | Attending: Emergency Medicine | Admitting: Emergency Medicine

## 2021-10-23 ENCOUNTER — Other Ambulatory Visit: Payer: Self-pay

## 2021-10-23 ENCOUNTER — Ambulatory Visit (HOSPITAL_COMMUNITY)
Admission: EM | Admit: 2021-10-23 | Discharge: 2021-10-23 | Disposition: A | Discharge status: Other Acute Inpatient Hospital | Payer: No Payment, Other | Attending: Psychiatry | Admitting: Psychiatry

## 2021-10-23 ENCOUNTER — Encounter (HOSPITAL_COMMUNITY): Payer: Self-pay | Admitting: Emergency Medicine

## 2021-10-23 DIAGNOSIS — Z5900 Homelessness unspecified: Secondary | ICD-10-CM | POA: Insufficient documentation

## 2021-10-23 DIAGNOSIS — F29 Unspecified psychosis not due to a substance or known physiological condition: Secondary | ICD-10-CM | POA: Insufficient documentation

## 2021-10-23 DIAGNOSIS — Z046 Encounter for general psychiatric examination, requested by authority: Secondary | ICD-10-CM | POA: Insufficient documentation

## 2021-10-23 DIAGNOSIS — F319 Bipolar disorder, unspecified: Secondary | ICD-10-CM | POA: Insufficient documentation

## 2021-10-23 NOTE — ED Provider Notes (Signed)
[place] DEPT Provider Note   CSN: [id number] Arrival date & time: 10/23/21  1309     History  Chief Complaint  Patient presents with   Mental Health Problem    Isaac Cabrera is a 36 y.o. male with a past medical history of bipolar disorder, schizophrenia, homelessness.  Presents emergency department with no complaint at this time.  [employer] officer reported that patient was kicked out of his hotel and then was transported to his mother home.  His mother did not allow him to stay with her so he requested transport for evaluation.  Patient was brought to behavioral health however requested to be taken to [facility] emergency department.  Patient denies any suicidal ideations, homicidal ideations, auditory hallucinations, visual hallucinations.  Patient denies any pain or complaint at this time.  Patient states "I need a coffee and a sandwich," "I need to leave by 6 AM tomorrow to go to court."  When possibility of discharge which was brought up patient states "I cannot be discharged I'm homeless."  Patient Dors is drinking 1 alcoholic beverage the last 24 hours.  Denies any illicit drug use.  Patient states that he is not currently taking any prescribed medications.     Mental Health Problem Presenting symptoms: no hallucinations, no self-mutilation and no suicidal thoughts   Associated symptoms: no abdominal pain, no chest pain and no headaches       Home Medications Prior to Admission medications   Medication Sig Start Date End Date Taking? Authorizing Provider  doxycycline (VIBRAMYCIN) 100 MG capsule Take 1 capsule (100 mg total) by mouth 2 (two) times daily. 05/02/21   Larene Pickett, PA-C  divalproex (DEPAKOTE) 500 MG DR tablet Take 2 tablets (1,000 mg total) by mouth at bedtime. 08/19/12 03/24/21  Reece Packer, NP  fluticasone (FLONASE) 50 MCG/ACT nasal spray Place 2 sprays into both nostrils daily. 01/02/14 03/24/21  Liam Graham, PA-C   OLANZapine (ZYPREXA) 10 MG tablet Take 1 tablet (10 mg total) by mouth at bedtime. 09/03/12 03/24/21  Ambrose Finland, MD  QUEtiapine (SEROQUEL XR) 400 MG 24 hr tablet Take 1 tablet (400 mg total) by mouth daily. 08/19/12 03/24/21  Reece Packer, NP      Allergies    Patient has no known allergies.    Review of Systems   Review of Systems  Constitutional:  Negative for chills and fever.  Eyes:  Negative for visual disturbance.  Respiratory:  Negative for shortness of breath.   Cardiovascular:  Negative for chest pain.  Gastrointestinal:  Negative for abdominal pain, nausea and vomiting.  Genitourinary:  Negative for difficulty urinating and dysuria.  Musculoskeletal:  Negative for back pain and neck pain.  Skin:  Negative for color change and rash.  Neurological:  Negative for dizziness, syncope, light-headedness and headaches.  Psychiatric/Behavioral:  Negative for confusion, hallucinations, self-injury and suicidal ideas.    Physical Exam Updated Vital Signs BP (!) 149/110 (BP Location: Left Arm)    Pulse 94    Temp 98.8 F (37.1 C) (Oral)    Resp 18    Ht 6\' 3"  (1.905 m)    Wt 81.6 kg    SpO2 98%    BMI 22.50 kg/m  Physical Exam Vitals and nursing note reviewed.  Constitutional:      General: He is not in acute distress.    Appearance: He is not ill-appearing, toxic-appearing or diaphoretic.  HENT:     Head: Normocephalic.  Eyes:  General: No scleral icterus.       Right eye: No discharge.        Left eye: No discharge.  Cardiovascular:     Rate and Rhythm: Normal rate.     Heart sounds: Normal heart sounds, S1 normal and S2 normal.  Pulmonary:     Effort: Pulmonary effort is normal. No tachypnea, bradypnea or respiratory distress.     Breath sounds: Normal breath sounds. No stridor.  Abdominal:     Palpations: Abdomen is soft.     Tenderness: There is no abdominal tenderness.  Musculoskeletal:     Cervical back: Neck supple.  Skin:    General: Skin  is warm and dry.  Neurological:     General: No focal deficit present.     Mental Status: He is alert and oriented to person, place, and time.     GCS: GCS eye subscore is 4. GCS verbal subscore is 5. GCS motor subscore is 6.  Psychiatric:        Attention and Perception: He is attentive. He does not perceive auditory or visual hallucinations.        Behavior: Behavior is cooperative.        Thought Content: Thought content is not paranoid or delusional. Thought content does not include homicidal or suicidal ideation.    ED Results / Procedures / Treatments   Labs (all labs ordered are listed, but only abnormal results are displayed) Labs Reviewed - No data to display  EKG None  Radiology No results found.  Procedures Procedures    Medications Ordered in ED Medications - No data to display  ED Course/ Medical Decision Making/ A&P                           Medical Decision Making  Alert 36 year old male no acute distress, nontoxic appearing.  Brought to our department by [employer].  Patient has no medical complaints at this time.  PD reported that patient was kicked out of hotel, brought to his mother's house however was not allowed to stay there, requested he be transferred to the hospital.  Per chart review patient was seen at behavioral health urgent care prior to coming to [facility].  From ER provider states " No suicidal or homicidal ideations was reported, patient didn't appear psychotic or experiencing any acute distress."  Patient continues to deny any SI, HI, AVH.  Denies any pain or discomfort.  Patient does not need psychiatric evaluation in the emergency department.  We will give patient information to follow-up behavioral health urgent care, patient given resources for shelters in the area, given resources for outpatient follow-up.  Discussed results, findings, treatment and follow up. Patient advised of return precautions. Patient verbalized understanding and agreed  with plan.         Final Clinical Impression(s) / ED Diagnoses Final diagnoses:  Homelessness    Rx / DC Orders ED Discharge Orders     None         Dyann Ruddle 10/23/21 1422    Milton Ferguson, MD 10/26/21 [phone]

## 2021-10-23 NOTE — ED Triage Notes (Signed)
BIB [employer] from [facility], pt states he does not know why he is here, starting cussing at this RN and said 'catch up.' Denies SI, HI. Aggressive and threatening to staff. Requesting coffee.

## 2021-10-23 NOTE — Discharge Instructions (Addendum)
Take all medications as prescribed. Keep all follow-up appointments as scheduled.  Do not consume alcohol or use illegal drugs while on prescription medications. Report any adverse effects from your medications to your primary care provider promptly.  In the event of recurrent symptoms or worsening symptoms, call 911, a crisis hotline, or go to the nearest emergency department for evaluation.   

## 2021-10-23 NOTE — ED Notes (Signed)
Pt discharged from this facility. Pt aox4, ambulatory with steady gait. Denies SI/HI or hallucinations.

## 2021-10-23 NOTE — ED Provider Notes (Addendum)
Behavioral Health Urgent Care Medical Screening Exam  Patient Name: Isaac Cabrera MRN: [id number] Date of Evaluation: 10/23/21 Chief Complaint:   Diagnosis:  Final diagnoses:  None    History of Present illness: Isaac Cabrera is a 36 y.o. male. presents to [facility] urgent care accompanied by [facility] ([employer]).  NP attempted to evaluate patient, however he is declining assessment.  Stating " I do not want to see by anybody here I want to go to [facility]." Patient then turned his back and stopped talking.   Per [employer] officer patient was kicked out of the local hotel and was then transported to his mother's home. [employer] officer's stated that his mother did not allow him to stay with her so he requested to follow-up at [facility] urgent care.   No suicidal or homicidal ideations was reported, patient didn't appear psychotic or experiencing any acute distress.  Isaac Cabrera was  transport patient to [facility] [employer] by [employer].   Psychiatric Specialty Exam  Presentation  General Appearance:Appropriate for Environment; Casual  Eye Contact:Good  Speech:Clear and Coherent; Normal Rate  Speech Volume:Normal  Handedness:Right   Mood and Affect  Mood:Euthymic  Affect:Appropriate; Congruent   Thought Process  Thought Processes:Coherent; Goal Directed  Descriptions of Associations:Intact  Orientation:Full (Time, Place and Person)  Thought Content:Logical; WDL  Diagnosis of Schizophrenia or Schizoaffective disorder in past: No   Hallucinations:None  Ideas of Reference:None  Suicidal Thoughts:No  Homicidal Thoughts:No   Sensorium  Memory:Immediate Good; Recent Good; Remote Good  Judgment:Good  Insight:Fair   Executive Functions  Concentration:Good  Attention Span:Good  Recall:Good  Fund of Knowledge:Good  Language:Good   Psychomotor Activity  Psychomotor Activity:Normal   Assets  Assets:Communication Skills; Desire for Improvement;  Financial Resources/Insurance; Housing; Intimacy; Physical Health; Leisure Time; Resilience; Social Support; Vocational/Educational   Sleep  Sleep:Good  Number of hours: No data recorded  No data recorded  Physical Exam: Physical Exam Neurological:     Mental Status: He is alert.   Review of Systems  All other systems reviewed and are negative. There were no vitals taken for this visit. There is no height or weight on file to calculate BMI.-  Vitals was refused  Musculoskeletal: Strength & Muscle Tone: within normal limits Gait & Station: normal Patient leans: N/A   [facility] MSE Discharge Disposition for Follow up and Recommendations: Based on my evaluation the patient does not appear to have an emergency medical condition and can be discharged with resources and follow up care in outpatient services for N/A   Oneta Rack, NP 10/23/2021, 1:00 PM

## 2021-10-23 NOTE — Discharge Instructions (Signed)
Your physical exam and lab results were reassuring.  For any concerns regarding your mental health diagnoses please follow-up with behavioral health urgent care and/or any of the resources listed on the attached paperwork.  I have also given you paperwork for shelters in the area.  Get help right away if: You are talking about suicide or wishing to die. You start making plans for how to commit suicide. You feel that you have no reason to live. You start making plans for putting your affairs in order, saying goodbye, or giving your possessions away. You feel guilt, shame, or unbearable pain, and it seems like there is no way out. You are engaging in risky behaviors that could lead to death.

## 2021-11-08 ENCOUNTER — Telehealth (HOSPITAL_COMMUNITY): Payer: Self-pay

## 2021-11-08 NOTE — BH Assessment (Signed)
Care Management - [facility] Follow Up Discharges   Writer attempted to make contact with patient today and was unsuccessful.  The phone number listed in epic is not a working number.   Per chart review, patient was provided with outpatient resources.  

## 2022-01-06 ENCOUNTER — Encounter (HOSPITAL_COMMUNITY): Payer: Self-pay | Admitting: Physician Assistant

## 2022-01-06 ENCOUNTER — Ambulatory Visit (INDEPENDENT_AMBULATORY_CARE_PROVIDER_SITE_OTHER): Payer: No Payment, Other | Admitting: Physician Assistant

## 2022-01-06 DIAGNOSIS — F3161 Bipolar disorder, current episode mixed, mild: Secondary | ICD-10-CM | POA: Diagnosis not present

## 2022-01-06 MED ORDER — OLANZAPINE 5 MG PO TABS: 5.0000 mg | ORAL_TABLET | Freq: Every day | ORAL | 2 refills | Status: AC

## 2022-01-06 MED ORDER — FLUOXETINE HCL 10 MG PO CAPS: 10.0000 mg | ORAL_CAPSULE | Freq: Every day | ORAL | 2 refills | Status: AC

## 2022-01-06 NOTE — Progress Notes (Signed)
Psychiatric Initial Adult Assessment  ? ?Virtual Visit via Telephone Note ? ?I connected with Isaac Cabrera on 01/06/22 at  9:30 AM EDT by telephone and verified that I am speaking with the correct person using two identifiers. ? ?Location: ?Patient: Home ?Provider: Clinic ?  ?I discussed the limitations, risks, security and privacy concerns of performing an evaluation and management service by telephone and the availability of in person appointments. I also discussed with the patient that there may be a patient responsible charge related to this service. The patient expressed understanding and agreed to proceed. ? ?Follow Up Instructions: ? ?I discussed the assessment and treatment plan with the patient. The patient was provided an opportunity to ask questions and all were answered. The patient agreed with the plan and demonstrated an understanding of the instructions. ?  ?The patient was advised to call back or seek an in-person evaluation if the symptoms worsen or if the condition fails to improve as anticipated. ? ?I provided 38 minutes of non-face-to-face time during this encounter. ? ?Meta Hatchet, PA ? ? ?Patient Identification: Isaac Cabrera ?MRN:  [id number] ?Date of Evaluation:  01/06/2022 ?Referral Source: New patient/referral by provider ?Chief Complaint:   ?Chief Complaint  ?Patient presents with  ? Medication Refill  ? ?Visit Diagnosis:  ?  ICD-10-CM   ?1. Bipolar disorder, current episode mixed, mild (HCC)  F31.61 OLANZapine (ZYPREXA) 5 MG tablet  ?  FLUoxetine (PROZAC) 10 MG capsule  ?  ? ? ?History of Present Illness:   ? ?Isaac Cabrera is a 36 year old male with a past psychiatric history significant for bipolar disorder who presents to [facility] via virtual telephone visit to establish psychiatric care and for medication management. ? ?Patient presents to the encounter due to recently running out of his current occasions.  Patient reports that he is  currently taking olanzapine 5 mg at bedtime and Prozac 10 mg daily.  Patient reports that he has been taking these medications for the past 45 days.  He reports that his medications were last prescribed by Tiajuana Amass, PVR.  He reports that he was recently released from jail and was enrolled into the reentry program where he would be assisted and finding housing and transportation after being released from jail.  ? ?Regards to his medications, patient states that they are working well.  He reports that the medications help maintain his sanity and focus.  They also help with managing symptoms of agitation, irritability, worthlessness, and low energy.  He states that he was given these medications to help manage his thought disorder.  He reports that he was diagnosed with bipolar disorder back in 2010 and was originally established with [employer].  While being seen at [facility], he states that his provider at that time stated that he was wrongly diagnosed and was weaned off his medications.  Later on, patient states that he had a dispute with an Technical sales engineer and was told he had a manic episode.  He reports that since taking his medications, he feels like Isaac Cabrera and has learned to turn the other cheek. ? ?Patient denies experiencing depression and states that he has been more optimistic.  He denies anxiety stating that he has never dealt with anxiety.  He reports that prior to being on medications, he has always been assertive, outspoken, and confident.  Patient denies experiencing any manic symptoms but states that the last time he had an episode related to his bipolar disorder was back in January 23.  Patient denies any stressors at this time and reports that he has shelter and is currently working.  Patient reports that he was last hospitalized due to mental health back in 2009/2010.  Patient reports that he was hospitalized and [place].  Patient denies a past history of suicide attempt.  He further denies past history  of self-harm. ? ?Patient is alert and oriented x4, calm, cooperative, and fully engaged in conversation during the encounter.  Patient denies suicidal or homicidal ideations.  He further denies auditory or visual hallucinations and does not appear to be responding to internal/external stimuli.  Patient endorses fair sleep and receives on average 5 to 6 hours of sleep each night.  Patient endorses increased appetite and eats on average 3 meals per day.  Patient denies alcohol consumption.  He endorses tobacco use and smokes on average 4 cigarettes/day.  Patient denies current illicit drug use but states that he has used cocaine in the past as well as marijuana.  Patient reports that he last used cocaine last June and he last used marijuana back in November/December. ? ?Associated Signs/Symptoms: ?Depression Symptoms:  weight gain, ?increased appetite, ?(Hypo) Manic Symptoms:   None ?Anxiety Symptoms:   None ?Psychotic Symptoms:   None ?PTSD Symptoms: ?Had a traumatic exposure:  Patient denies traumatic experience ?Had a traumatic exposure in the last month:  N/A ?Re-experiencing:  None ?Hypervigilance:  No ?Hyperarousal:  None ?Avoidance:  None ? ?Past Psychiatric History:  ?Bipolar disorder ?Per chart review: ? ?Previous Psychotropic Medications: Yes  ? ?Substance Abuse History in the last 12 months:  Yes.   ? ?Consequences of Substance Abuse: ?Medical Consequences:  None ?Legal Consequences:  Patient reports that he received a possession charge in 2016 ?Family Consequences:  None ?Blackouts:  None ?DT's: None ?Withdrawal Symptoms:   None ? ?Past Medical History:  ?Past Medical History:  ?Diagnosis Date  ? Bipolar affective disorder (HCC) 2008  ? Psychosis (HCC)   ? Schizophrenia (HCC)   ? History reviewed. No pertinent surgical history. ? ?Family Psychiatric History:  ?Patient denies a family history of psychiatric illness ? ?Family History: History reviewed. No pertinent family history. ? ?Social History:    ?Social History  ? ?Socioeconomic History  ? Marital status: Single  ?  Spouse name: Not on file  ? Number of children: Not on file  ? Years of education: Not on file  ? Highest education level: Not on file  ?Occupational History  ? Not on file  ?Tobacco Use  ? Smoking status: Some Days  ?  Packs/day: 0.25  ?  Years: 5.00  ?  Pack years: 1.25  ?  Types: Cigarettes  ? Smokeless tobacco: Never  ?Substance and Sexual Activity  ? Alcohol use: Yes  ?  Alcohol/week: 2.0 standard drinks  ?  Types: 2 Cans of beer per week  ?  Comment: 2 Xs/week  ? Drug use: No  ? Sexual activity: Yes  ?  Birth control/protection: None  ?Other Topics Concern  ? Not on file  ?Social History Narrative  ? Not on file  ? ?Social Determinants of Health  ? ?Financial Resource Strain: Not on file  ?Food Insecurity: Not on file  ?Transportation Needs: Not on file  ?Physical Activity: Not on file  ?Stress: Not on file  ?Social Connections: Not on file  ? ? ?Additional Social History:  ?Patient is currently employed at a moving company. Patient endorses housing as well as transportation. Patient denies wanting to be set up with  a Veterinary surgeoncounselor. ? ?Allergies:  No Known Allergies ? ?Metabolic Disorder Labs: ?Lab Results  ?Component Value Date  ? HGBA1C 5.6 03/23/2021  ? MPG 114.02 03/23/2021  ? ?Lab Results  ?Component Value Date  ? PROLACTIN 113.0 (H) 03/23/2021  ? ?Lab Results  ?Component Value Date  ? CHOL 168 03/23/2021  ? TRIG 56 03/23/2021  ? HDL 85 03/23/2021  ? CHOLHDL 2.0 03/23/2021  ? VLDL 11 03/23/2021  ? LDLCALC 72 03/23/2021  ? ?Lab Results  ?Component Value Date  ? TSH 0.652 03/23/2021  ? ? ?Therapeutic Level Labs: ?No results found for: LITHIUM ?No results found for: CBMZ ?No results found for: VALPROATE ? ?Current Medications: ?Current Outpatient Medications  ?Medication Sig Dispense Refill  ? FLUoxetine (PROZAC) 10 MG capsule Take 1 capsule (10 mg total) by mouth daily. 30 capsule 2  ? OLANZapine (ZYPREXA) 5 MG tablet Take 1 tablet (5 mg  total) by mouth at bedtime. 30 tablet 2  ? doxycycline (VIBRAMYCIN) 100 MG capsule Take 1 capsule (100 mg total) by mouth 2 (two) times daily. 20 capsule 0  ? ?No current facility-administered medications for this

## 2022-02-21 ENCOUNTER — Telehealth (HOSPITAL_COMMUNITY): Payer: PRIVATE HEALTH INSURANCE | Admitting: Physician Assistant
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
## 2022-05-02 ENCOUNTER — Other Ambulatory Visit (HOSPITAL_COMMUNITY): Payer: Self-pay | Admitting: Physician Assistant

## 2022-05-02 DIAGNOSIS — F3161 Bipolar disorder, current episode mixed, mild: Secondary | ICD-10-CM

## 2022-05-03 ENCOUNTER — Telehealth (HOSPITAL_COMMUNITY): Payer: Self-pay | Admitting: *Deleted

## 2022-05-03 NOTE — Telephone Encounter (Signed)
Patient of [facility] Back Forwarding to Dr Antony Salmon   Rx Refill Request -- FLUoxetine (PROZAC) 10 MG capsule Take 1 capsule (10 mg total) by mouth daily

## 2022-05-08 ENCOUNTER — Telehealth (HOSPITAL_COMMUNITY): Payer: Self-pay | Admitting: *Deleted

## 2022-05-08 NOTE — Telephone Encounter (Signed)
Opened to check appt status -- patient needs appt  01/2022  last seen
